# Patient Record
Sex: Female | Born: 1980 | Race: Black or African American | Hispanic: No | Marital: Single | Smoking: Never smoker
Health system: Southern US, Community
[De-identification: ages and names within clinical notes are randomized; demographics above are authoritative.]

---
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
## 2000-01-31 ENCOUNTER — Emergency Department (HOSPITAL_COMMUNITY): Admission: EM | Admit: 2000-01-31 | Discharge: 2000-01-31 | Payer: Self-pay | Admitting: Emergency Medicine

## 2000-01-31 ENCOUNTER — Encounter: Payer: Self-pay | Admitting: Emergency Medicine

## 2000-04-25 ENCOUNTER — Emergency Department (HOSPITAL_COMMUNITY): Admission: EM | Admit: 2000-04-25 | Discharge: 2000-04-25 | Payer: Self-pay | Admitting: Emergency Medicine

## 2000-05-14 ENCOUNTER — Inpatient Hospital Stay (HOSPITAL_COMMUNITY): Admission: AD | Admit: 2000-05-14 | Discharge: 2000-05-14 | Payer: Self-pay | Admitting: *Deleted

## 2000-11-29 ENCOUNTER — Other Ambulatory Visit: Admission: RE | Admit: 2000-11-29 | Discharge: 2000-11-29 | Payer: Self-pay | Admitting: Obstetrics and Gynecology

## 2001-02-26 ENCOUNTER — Inpatient Hospital Stay (HOSPITAL_COMMUNITY): Admission: AD | Admit: 2001-02-26 | Discharge: 2001-02-26 | Payer: Self-pay | Admitting: Obstetrics and Gynecology

## 2001-05-14 ENCOUNTER — Inpatient Hospital Stay (HOSPITAL_COMMUNITY): Admission: AD | Admit: 2001-05-14 | Discharge: 2001-05-14 | Payer: Self-pay | Admitting: Obstetrics and Gynecology

## 2001-05-22 ENCOUNTER — Inpatient Hospital Stay (HOSPITAL_COMMUNITY): Admission: AD | Admit: 2001-05-22 | Discharge: 2001-05-22 | Payer: Self-pay | Admitting: Obstetrics and Gynecology

## 2001-06-21 ENCOUNTER — Observation Stay (HOSPITAL_COMMUNITY): Admission: AD | Admit: 2001-06-21 | Discharge: 2001-06-21 | Payer: Self-pay | Admitting: Obstetrics and Gynecology

## 2001-06-22 ENCOUNTER — Inpatient Hospital Stay (HOSPITAL_COMMUNITY): Admission: AD | Admit: 2001-06-22 | Discharge: 2001-06-22 | Payer: Self-pay | Admitting: Obstetrics

## 2001-06-23 ENCOUNTER — Observation Stay (HOSPITAL_COMMUNITY): Admission: AD | Admit: 2001-06-23 | Discharge: 2001-06-23 | Payer: Self-pay | Admitting: Obstetrics and Gynecology

## 2001-06-24 ENCOUNTER — Inpatient Hospital Stay (HOSPITAL_COMMUNITY): Admission: AD | Admit: 2001-06-24 | Discharge: 2001-06-28 | Payer: Self-pay | Admitting: Obstetrics & Gynecology

## 2001-11-16 ENCOUNTER — Emergency Department (HOSPITAL_COMMUNITY): Admission: EM | Admit: 2001-11-16 | Discharge: 2001-11-17 | Payer: Self-pay

## 2001-12-06 ENCOUNTER — Encounter: Admission: RE | Admit: 2001-12-06 | Discharge: 2001-12-06 | Payer: Self-pay | Admitting: *Deleted

## 2002-06-29 ENCOUNTER — Emergency Department (HOSPITAL_COMMUNITY): Admission: EM | Admit: 2002-06-29 | Discharge: 2002-06-30 | Payer: Self-pay | Admitting: Emergency Medicine

## 2002-12-19 ENCOUNTER — Emergency Department (HOSPITAL_COMMUNITY): Admission: EM | Admit: 2002-12-19 | Discharge: 2002-12-19 | Payer: Self-pay | Admitting: *Deleted

## 2002-12-29 ENCOUNTER — Emergency Department (HOSPITAL_COMMUNITY): Admission: EM | Admit: 2002-12-29 | Discharge: 2002-12-29 | Payer: Self-pay | Admitting: Emergency Medicine

## 2002-12-29 ENCOUNTER — Encounter: Payer: Self-pay | Admitting: Emergency Medicine

## 2003-12-04 ENCOUNTER — Encounter: Admission: RE | Admit: 2003-12-04 | Discharge: 2003-12-04 | Payer: Self-pay | Admitting: Internal Medicine

## 2003-12-27 ENCOUNTER — Encounter: Admission: RE | Admit: 2003-12-27 | Discharge: 2003-12-27 | Payer: Self-pay | Admitting: Internal Medicine

## 2004-03-30 ENCOUNTER — Inpatient Hospital Stay (HOSPITAL_COMMUNITY): Admission: AD | Admit: 2004-03-30 | Discharge: 2004-03-30 | Payer: Self-pay | Admitting: *Deleted

## 2004-03-31 ENCOUNTER — Emergency Department (HOSPITAL_COMMUNITY): Admission: EM | Admit: 2004-03-31 | Discharge: 2004-03-31 | Payer: Self-pay | Admitting: Emergency Medicine

## 2004-04-04 ENCOUNTER — Inpatient Hospital Stay (HOSPITAL_COMMUNITY): Admission: AD | Admit: 2004-04-04 | Discharge: 2004-04-04 | Payer: Self-pay | Admitting: *Deleted

## 2004-09-30 ENCOUNTER — Emergency Department (HOSPITAL_COMMUNITY): Admission: EM | Admit: 2004-09-30 | Discharge: 2004-09-30 | Payer: Self-pay | Admitting: Emergency Medicine

## 2004-10-24 ENCOUNTER — Emergency Department (HOSPITAL_COMMUNITY): Admission: EM | Admit: 2004-10-24 | Discharge: 2004-10-24 | Payer: Self-pay | Admitting: Emergency Medicine

## 2004-10-24 ENCOUNTER — Inpatient Hospital Stay (HOSPITAL_COMMUNITY): Admission: AD | Admit: 2004-10-24 | Discharge: 2004-10-24 | Payer: Self-pay | Admitting: Obstetrics & Gynecology

## 2004-10-28 ENCOUNTER — Emergency Department (HOSPITAL_COMMUNITY): Admission: EM | Admit: 2004-10-28 | Discharge: 2004-10-28 | Payer: Self-pay | Admitting: Family Medicine

## 2004-11-01 ENCOUNTER — Observation Stay (HOSPITAL_COMMUNITY): Admission: AD | Admit: 2004-11-01 | Discharge: 2004-11-02 | Payer: Self-pay | Admitting: Obstetrics and Gynecology

## 2004-12-04 ENCOUNTER — Other Ambulatory Visit: Admission: RE | Admit: 2004-12-04 | Discharge: 2004-12-04 | Payer: Self-pay | Admitting: Obstetrics and Gynecology

## 2005-04-10 ENCOUNTER — Inpatient Hospital Stay (HOSPITAL_COMMUNITY): Admission: RE | Admit: 2005-04-10 | Discharge: 2005-04-13 | Payer: Self-pay | Admitting: Obstetrics and Gynecology

## 2005-04-14 ENCOUNTER — Inpatient Hospital Stay (HOSPITAL_COMMUNITY): Admission: AD | Admit: 2005-04-14 | Discharge: 2005-04-14 | Payer: Self-pay | Admitting: Obstetrics and Gynecology

## 2005-05-28 ENCOUNTER — Inpatient Hospital Stay (HOSPITAL_COMMUNITY): Admission: AD | Admit: 2005-05-28 | Discharge: 2005-05-28 | Payer: Self-pay | Admitting: Obstetrics and Gynecology

## 2005-06-16 ENCOUNTER — Encounter (INDEPENDENT_AMBULATORY_CARE_PROVIDER_SITE_OTHER): Payer: Self-pay | Admitting: *Deleted

## 2005-06-16 ENCOUNTER — Inpatient Hospital Stay (HOSPITAL_COMMUNITY): Admission: RE | Admit: 2005-06-16 | Discharge: 2005-06-19 | Payer: Self-pay | Admitting: Obstetrics and Gynecology

## 2006-03-26 ENCOUNTER — Emergency Department (HOSPITAL_COMMUNITY): Admission: EM | Admit: 2006-03-26 | Discharge: 2006-03-26 | Payer: Self-pay | Admitting: Emergency Medicine

## 2006-09-20 ENCOUNTER — Emergency Department (HOSPITAL_COMMUNITY): Admission: EM | Admit: 2006-09-20 | Discharge: 2006-09-20 | Payer: Self-pay | Admitting: Family Medicine

## 2006-11-25 ENCOUNTER — Emergency Department (HOSPITAL_COMMUNITY): Admission: EM | Admit: 2006-11-25 | Discharge: 2006-11-25 | Payer: Self-pay | Admitting: Family Medicine

## 2008-09-14 ENCOUNTER — Emergency Department (HOSPITAL_COMMUNITY): Admission: EM | Admit: 2008-09-14 | Discharge: 2008-09-14 | Payer: Self-pay | Admitting: Emergency Medicine

## 2010-12-06 ENCOUNTER — Emergency Department (HOSPITAL_COMMUNITY)
Admission: EM | Admit: 2010-12-06 | Discharge: 2010-12-06 | Payer: Self-pay | Source: Home / Self Care | Admitting: Emergency Medicine

## 2010-12-07 ENCOUNTER — Emergency Department (HOSPITAL_COMMUNITY)
Admission: EM | Admit: 2010-12-07 | Discharge: 2010-12-07 | Payer: Self-pay | Source: Home / Self Care | Admitting: Emergency Medicine

## 2011-04-20 ENCOUNTER — Emergency Department (HOSPITAL_COMMUNITY): Payer: Self-pay

## 2011-04-20 ENCOUNTER — Emergency Department (HOSPITAL_COMMUNITY)
Admission: EM | Admit: 2011-04-20 | Discharge: 2011-04-20 | Disposition: A | Discharge status: Home / Self Care | Payer: Self-pay | Attending: Emergency Medicine | Admitting: Emergency Medicine

## 2011-04-20 DIAGNOSIS — M549 Dorsalgia, unspecified: Secondary | ICD-10-CM | POA: Insufficient documentation

## 2011-04-20 DIAGNOSIS — R071 Chest pain on breathing: Secondary | ICD-10-CM | POA: Insufficient documentation

## 2011-04-20 DIAGNOSIS — R0602 Shortness of breath: Secondary | ICD-10-CM | POA: Insufficient documentation

## 2011-04-20 DIAGNOSIS — I517 Cardiomegaly: Secondary | ICD-10-CM | POA: Insufficient documentation

## 2011-04-20 LAB — BASIC METABOLIC PANEL WITH GFR
BUN: 10 mg/dL (ref 6–23)
CO2: 28 meq/L (ref 19–32)
Calcium: 8.7 mg/dL (ref 8.4–10.5)
Chloride: 107 meq/L (ref 96–112)
Creatinine, Ser: 0.76 mg/dL (ref 0.4–1.2)
GFR calc non Af Amer: >60 mL/min
Glucose, Bld: 87 mg/dL (ref 70–99)
Potassium: 3.9 meq/L (ref 3.5–5.1)
Sodium: 141 meq/L (ref 135–145)

## 2011-05-08 NOTE — Discharge Summary (Signed)
[name], [name]                   ACCOUNT NO.:  [id number]   MEDICAL RECORD NO.:  [id number]          PATIENT TYPE:  INP   LOCATION:  9148                          [facility]ILITY:  [facility]   PHYSICIAN:  Hal Morales, M.D.DATE OF BIRTH:  1981-05-07   DATE OF ADMISSION:  06/16/2005  DATE OF DISCHARGE:  06/19/2005                                 DISCHARGE SUMMARY   ADMISSION DIAGNOSES:  1.  Term intrauterine pregnancy.  2.  Previous cesarean section.  3.  Desired repeat cesarean section.  4.  Desired sterilization.   DISCHARGE DIAGNOSES:  1.  Term intrauterine pregnancy.  2.  Previous cesarean section.  3.  Desired repeat cesarean section.  4.  Desired sterilization.  5.  Upper respiratory infection.  6.  Post operative fever, probably secondary to #5   PROCEDURES:  1.  Repeat low transverse cesarean section.  2.  Bilateral tubal sterilization.  3.  Spinal anesthesia.   HOSPITAL COURSE:  Ms. Hoeschen is a 30 year old gravida 4, para 1-2-1 at 38-6/7  weeks who was admitted on June 16, 2005 for an elective repeat cesarean  section and tubal sterilization.  The pregnancy has been remarkable for 1.  Previous cesarean section.  2.  First trimester bleeding.  3.  History of  post partum depression.  4.  Desired sterilization.  The patient was taken  to the operating room where a repeat low transverse cesarean section was  performed by Dr. Dierdre Forth under spinal anesthesia.  The findings were  a viable female.  Weight 7 pounds 7 ounces.  Apgars were 9 and 9.  The  infant's name was Tamia.  The patient tolerated the procedure well.  She was  taken to the recovery room in good condition.  The infant was taken to the  full term nursery.  The evening after surgery, the patient developed a  temperature elevation to 102.3.  Blood cultures, urine culture, chest xray  were all ordered, and the patient was started on Unasyn. By postoperative  day #1 the patient was recovering well.  However,  she had a continuing cough  and upper respiratory congestion.  The baby was breast feeding.  Her  hemoglobin on day #1 was 9.7, down from 11.4.  White blood cell count was  9.1 and platelet count was 243.  The patient had a productive cough with  clear sputum.  A chest x-ray showed clear lungs.  Incentive spirometry was  begun and ambulation was encouraged.  The patient continued on Unasyn for a  three day course.  By postoperative day #2 the patient was improving.  Her  cultures were noted to be negative so far.  A blood culture was still  pending.  Her temperature maximum was 101.9 at 11 a.m. on June 17, 2005.  She had a JP drain and subcuticular sutures.  The JP drain was draining a  small amount.  By postoperative day #3 the patient was doing well.  She had  a slight dizzy spell on the evening of June 18, 2005.  However, this was  just  after a Percocet dose.  A sporadic cough with clear mucus and phlegm  was noted and there was some mild nasal congestion.  The patient had been  afebrile for greater than 24 hours at that time.  Her incision was clean,  dry, and intact.  The Jackson-Pratt drain was draining a very, very small  amount.  Her abdomen was soft and non-tender.  Dr. Pennie Rushing was consulted.  The decision was made to remove the JP drain, which was done without  difficulty and to discharge the patient home with the plan for four  additional days of oral antibiotics.  The patient did feel well and was able  to be up ambulating without difficulty.  She was deemed to receive full  benefit of her hospital stay and was discharged home.   DISCHARGE INSTRUCTIONS:  Per [facility] handout.   DISCHARGE MEDICATIONS:  1.  Motrin 600 mg p.o. q.6h. p.r.n. pain.  2.  Percocet 5/325 one-two p.o. q.three-four hours p.r.n. pain.  3.  Augmentin 875 mg take one p.o. b.i.d. xfour days.  4.  Prenatal vitamin one p.o. daily.   ADDENDUM:  There were also no signs or symptoms of post partum  depression.       VLL/MEDQ  D:  06/19/2005  T:  06/19/2005  Job:  [id number]

## 2011-05-08 NOTE — Op Note (Signed)
[facility]  Patient:    [name], [name]                          MRN: [id number] Proc. Date: 06/25/01 Adm. Date:  [id number] Attending:  Leonard Schwartz                           Operative Report  PREOPERATIVE DIAGNOSES:       1. Intrauterine pregnancy at term.                               2. Failure to descend.  POSTOPERATIVE DIAGNOSES:      1. Intrauterine pregnancy at term.                               2. Failure to descend.                               3. Cephalopelvic disproportion.  OPERATION:                    Primary low-transverse cesarean section.  SURGEON:                      Vanessa P. Pennie Rushing, M.D.  FIRST ASSISTANT:              Wynelle Bourgeois, CNM  ANESTHESIA:                   Epidural.  ESTIMATED BLOOD LOSS:         Less than 750 cc.  COMPLICATIONS:                None.  FINDINGS:                     The patient was delivered of a female infant who name is [name], weighing 8 pounds with Apgars of 7 and 9 at one and five minutes, respectively.  The placenta contained an eccentrically inserted three-vessel cord.  PROCEDURE:                    The patient was taken to the operating room after appropriate identification with a labor epidural and Foley catheter in place.  She was placed on the operating table in the supine position with a left lateral tilt.  The abdomen was prepped with multiple layers of Betadine and draped as a sterile field.  After assurance of adequate anesthesia, a transverse incision was made in the abdomen and the abdomen opened in layers. The peritoneum was entered.  The uterus was incised approximately 1 cm above the uterovesical fold and the infant delivered from the opposite transverse position.  And after having the nares and pharynx suctioned and the cord clamped and cut, was handed off to the awaiting pediatricians.  The appropriate cord blood was drawn and the placenta noted to have separated  from the uterus and was removed with gentle traction.  The uterine incision was then closed with a running interlocking suture of 0 Vicryl.  An imbricating suture of 0 Vicryl was then placed.  Hemostasis was noted to be adequate.  The visceral peritoneum was repaired with a figure-of-eight suture of 2-0  Vicryl. Copious irrigation was carried out and the abdominal peritoneum closed with a running suture of 2-0 Vicryl.  The rectus muscles were reapproximated in the midline with a figure-of-eight suture of 2-0 Vicryl.  The rectus fascia was then closed with a running suture of 0 Vicryl, then reinforced on either side of midline with figure-of-eight sutures of 0 Vicryl.  The subcutaneous tissue was irrigated and made hemostatic with Bovie cautery.  Skin staples were applied to the skin incision.  A sterile dressing was applied.  The patient was then taken from the operating room to the recovery room in satisfactory condition, having tolerated the procedure well with sponge and instrument counts correct.  The infant went to the full term nursery. DD:  06/25/01 TD:  06/25/01 Job: [id number]

## 2011-05-08 NOTE — H&P (Signed)
NAMEARIELLAH, FAUST NO.:  [id number]   MEDICAL RECORD NO.:  [id number]           PATIENT TYPE:   LOCATION:                                 FACILITY:   PHYSICIAN:  Hal Morales, M.D.     DATE OF BIRTH:   DATE OF PROCEDURE:  DATE OF DISCHARGE:                      STAT - MUST CHANGE TO CORRECT WORK TYPE   HISTORY:  This is a 30 year old gravida 4, para 1, 0, 2, 1, at 38-6/7 weeks,  who presents for an elective repeat cesarean section and tubal ligation.  Pregnancy has been followed by Dr. Pennie Rushing, unremarkable for:  1. Previous C. section.  2. First trimester bleeding.  3. History of postpartum depression.   OBSTETRIC HISTORY:  Remarkable for an elective abortion in 1998 at [redacted] weeks  gestation. A C-section in 2002 of a female infant at [redacted] weeks gestation,  weighing 8 pounds, for failure to descend after 17 hours of labor. She had a  spontaneous abortion in 2005 at 4 weeks without surgery.   PAST MEDICAL HISTORY:  1. Remarkable for history of postpartum depression in 2002 which lasted 2      weeks with no medications.  2. She has a history of gonorrhea in 2003.  3. Childhood Varicella.  4. History of migraines as a younger person but none recently.   SOCIAL HISTORY:  Remarkable for a C-section in 2002.   FAMILY HISTORY:  Remarkable for an aunt with heart attack. Mother with  hypertension, grandmother with hypertension, and a cousin with hypertension.  Grandfather and grandmother with diabetes. Mother with seizures. A  grandmother with stroke. A grandmother with brain tumor.   GENETIC HISTORY:  Unremarkable.   SOCIAL HISTORY:  The patient is engaged to [employer] who is  involved and supportive. She does not report a religious affiliation. She  denies any alcohol, tobacco or drug use.   PRENATAL LABS:  Hemoglobin 12.9. Blood type O positive, antibody screen  negative. Sickle cell negative. RPR nonreactive. Rubella immune. Hepatitis  Negative. HIV negative. Cystic fibrosis negative. Glucola within normal  limits.   HISTORY OF CURRENT PREGNANCY:  The patient entered care at [redacted] weeks  gestation. She had a fall at 16 weeks with no sequelae. She had a normal  ultrasound at 19 weeks. She had some unexplained mild proteinuria at 23  weeks which resolved spontaneously. She had Glucola at 27 weeks which was  normal. She had some preterm labor at 28 weeks treated with bedrest and  terbutaline. She presents at term for repeat cesarean section and tubal  ligation.   PHYSICAL EXAMINATION:  VITAL SIGNS:  Stable, afebrile.  HEENT:  Within normal limits.  NECK:  Thyroid normal, no enlargement.  CHEST:  Clear to auscultation.  HEART:  Regular rate and rhythm.  ABDOMEN:  Gravid at 38 cm, vertex to Leopold's. Fetal heart rate 150's.  PELVIC EXAM:  Deferred.  EXTREMITIES:  Within normal limits.   ASSESSMENT:  1. Intrauterine pregnancy at 75 and 6/7 weeks.  2. Previous cesarean section.  3. Desires repeat cesarean section and tubal ligation.  PLAN:  Admit to operating suite per protocol and further orders to follow. A  discussion was held with the patient concerning the indication for her  procedures and the risks involved.  She desires no further children, but  acknowledges understanding that there is a small failure rate associated  with tubal sterilization which could result in subsequent pregnancy.  She  likewise acknowledges the risks of anesthesia, infection, bleeding and  damage to adjacent organs.       MLW/MEDQ  D:  06/16/2005  T:  06/16/2005  Job:  [id number]

## 2011-05-08 NOTE — Discharge Summary (Signed)
NAMEANIQUA, BRIERE NO.:  [id number]   MEDICAL RECORD NO.:  [id number]          PATIENT TYPE:  OBV   LOCATION:  9319                          [facility]ACILITY:  [facility]   PHYSICIAN:  Janine Limbo, M.D.DATE OF BIRTH:  1981/04/27   DATE OF ADMISSION:  11/01/2004  DATE OF DISCHARGE:  11/02/2004                                 DISCHARGE SUMMARY   ADMITTING DIAGNOSES:  1.  First trimester pregnancy.  2.  Nausea and vomiting.  3.  Hyperemesis.   DISCHARGE DIAGNOSES:  1.  First trimester pregnancy.  2.  Nausea and vomiting.  3.  Hyperemesis (stable on discharge).   Ms. Brandstetter is a 30 year old gravida 3 para 1-0-2-1 who presented at [redacted] weeks  gestation with severe nausea and vomiting.  She was admitted overnight for  IV fluid hydration and antiemetics.  These worked well and by the following  day she was able to keep down a regular diet with no further nausea and  vomiting.  Her vital signs were stable, she remained afebrile.  Lab work:  Her UA on admission showed ketones and follow-up UA was negative.  Her  quantitative hCG was 36,967.  Her ultrasound showed a viable intrauterine  pregnancy at 5 weeks 6 days.  Her chest was clear, her heart regular rate  and rhythm, abdomen soft and nontender.  In light of the fact that she was  stable and able to tolerate p.o. well after 24 hours, she was discharged  home.   ASSESSMENT:  First trimester pregnancy, nausea and vomiting/hyperemesis.   PLAN:  The patient is discharged home.  She is given a prescription for  Phenergan 25 mg p.o. q.6h. p.r.n. nausea and vomiting.  She may take  Tylenol, Sudafed, or Robitussin as needed for cold symptoms.  She will  return to the office of [facility] as scheduled next week for her new OB visit,  and call for any problems or concerns in the meantime.     Pecolia Ades   SDM/MEDQ  D:  11/23/2004  T:  11/24/2004  Job:  [id number]

## 2011-05-08 NOTE — Discharge Summary (Signed)
[name], [name]                   ACCOUNT NO.:  [id number]   MEDICAL RECORD NO.:  [id number]          PATIENT TYPE:  INP   LOCATION:  9158                          [facility]ILITY:  [facility]   PHYSICIAN:  Naima A. Dillard, M.D. DATE OF BIRTH:  09/20/1981   DATE OF ADMISSION:  04/10/2005  DATE OF DISCHARGE:  04/10/2005                                 DISCHARGE SUMMARY   ADMISSION DIAGNOSES:  1.  Intrauterine pregnancy at 29-2/7 weeks.  2.  Preterm labor.  3.  Premature cervical effacement.   DISCHARGE DIAGNOSES:  1.  Intrauterine pregnancy at 29-2/7 weeks.  2.  Preterm labor.  3.  Premature cervical effacement.   HOSPITAL PROCEDURES:  1.  Electronic fetal monitoring.  2.  Ultrasound.  3.  Betamethasone series.   HOSPITAL COURSE:  The patient was admitted for an incidental finding of  premature cervical effacement on ultrasound.  She had been scheduled to have  a gallbladder ultrasound but that was not done due to the patient eating.  The patient reported increased pressure and cramps for the past week.  Upon  admission her fetal heart rate was reactive.  There was uterine irritability  noted.  She was given a dose of Terbutaline.  Liver function tests were  within normal limits.  She was given a betamethasone series to enhance fetal  lung maturity.  Fetal fibronectin was done the next day and returned with a  positive result.  She was kept on bedrest and monitored throughout.  She did  well.  On April 13, 2005 she was doing well and feeling a few scattered  contractions, which were not strong.  Vital signs were stable.  Chest was  clear.  Heart rate was a regular rate and rhythm.  Abdomen nontender.  Fetal  heart rate was reassuring with accelerations.  There were no uterine  contractions visible on the monitor.  The cervical exam was deferred.  Extremities were within normal limits.  She was deemed to have receive the  full benefit of her hospital stay and was discharged home.   DISCHARGE MEDICATIONS:  Terbutaline 2.5 mg p.o. q.4h. p.r.n.   DISCHARGE LABORATORIES:  Group B streptococcus was negative.  Fetal  fibronectin positive.   DISCHARGE INSTRUCTIONS:  The patient will remain on level III bedrest.   DISCHARGE FOLLOWUP:  The patient will return to the office in one week for  evaluation or p.r.n.      MLW/MEDQ  D:  04/13/2005  T:  04/13/2005  Job:  [id number]

## 2011-05-08 NOTE — H&P (Signed)
[facility]  Patient:    [name], [name]                          MRN: [id number] Adm. Date:  [id number] Disc. Date: [id number] Attending:  Leonard Schwartz Dictator:   Wynelle Bourgeois, CNM                         History and Physical  HISTORY OF PRESENT ILLNESS:   This is a 30 year old G3, para 0-0-2-0 at 39-3/7 weeks who presents with complaints of regular uterine contractions times several hours today.  She denies leaking or bleeding and reports positive fetal movement.  Her pregnancy has been followed by Dr. Erie Noe P. Haygood and has been remarkable for:  #1 - First trimester bleeding, and #2 - teen, and #3 - preterm labor.  PRENATAL LABORATORY DATA:     Hemoglobin of 12.4, hematocrit of 38.1, platelets 318,000.  Blood type O-positive, antibody screen negative.  Sickle cell negative.  RPR nonreactive.  Rubella immune.  HBsAg negative.  Pap test within normal limits.  Gonorrhea negative.  Chlamydia negative.  AFP free beta within normal limits.  Glucose challenge within normal limits.  Group B strep results not available.  OBSTETRICAL HISTORY:          Remarkable for elective ABs in July of 2000 and July of 2001.  MEDICAL HISTORY:              Remarkable for rare yeast infection, childhood varicella.  SURGICAL HISTORY:             Remarkable for D&Cs with abortions x 2.  FAMILY HISTORY:               Remarkable for maternal aunt with congestive heart failure, maternal grandmother with MI, maternal grandmother with hypertension, maternal grandfather with thrombus, maternal grandfather with COPD, maternal grandmother with insulin-dependent diabetes, mother with seizures and a paternal grandmother with a brain tumor.  GENETIC HISTORY:              Unremarkable.  SOCIAL HISTORY:               Patient is single, involved with Johnathan Hausen, who is presently not here at this moment.  She is of the [place] faith.  She denies any alcohol,  tobacco or drug use.  PHYSICAL EXAMINATION:  VITAL SIGNS:                  Vital signs stable, afebrile.  HEENT:                        Within normal limits.  NECK:                         Thyroid normal, not enlarged.  BREASTS:                      Soft, nontender.  No masses.  CARDIOVASCULAR:               Regular rate and rhythm.  RESPIRATORY:                  Clear to auscultation bilaterally.  ABDOMEN:                      Gravid at 40 cm.  EFM  shows fetal heart rate of 150s with occasional variable decelerations and uterine contractions every one and a half to two minutes.  PELVIC:                       Cervical exam is 3 to 4 cm, 90% effaced and -2 station.  EXTREMITIES:                  Within normal limits.  ASSESSMENT:                   1. Intrauterine pregnancy at 39-3/7 weeks.                               2. Early active labor.  PLAN:                         1. Admit to birthing suite per                                  Dr. Janine Limbo.                               2. Routine M.D. orders.                               3. Analgesia with Stadol and Phenergan for now,                                  then epidural after 4 cm.                               4. Further orders per Dr. Stefano Gaul. DD:  06/21/01 TD:  06/21/01 Job: [id number]
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
## 2011-05-08 NOTE — Discharge Summary (Signed)
[facility] [place]  Patient:    [name], [name]                          MRN: [id number] Adm. Date:  [id number] Disc. Date: 06/28/01 Attending:  Leonard Schwartz Dictator:   Wynelle Bourgeois, C.N.M.                           Discharge Summary  ADMISSION DIAGNOSES:          1. Intrauterine pregnancy at term.                               2. Spontaneous rupture of membranes.                               3. Early active labor.                               4. Desires epidural.  DISCHARGE DIAGNOSES:          1. intrauterine pregnancy at term.                               2. Spontaneous rupture of membranes.                               3. Early active labor.                               4. Desires epidural.                               5. Status post primary low transverse cesarean                                  section.                               6. Endometritis.  PROCEDURE:                    1. Epidural anesthesia.                               2. Primary low transverse cesarean section.                               3. Intravenous antibiotics.  HOSPITAL COURSE:              This is a 30 year old G3, para 0-0-2-0 at 65 6/7 weeks who presented from the office in early labor and proceeded to have spontaneous rupture of membranes with ensuing labor.  She received an epidural for anesthesia at 8:40 p.m. on June 24, 2001 and continued to have labor with internal monitors placed for accurate monitoring of labor.  On June 25, 2001 in the  morning she had progressed to 9+ cm and began pushing at approximately 8:30 a.m. with marked fetal heart rate decelerations and she had pushed the vertex down to caput at +2 station.  During second stage she continued to have decelerations of the fetal heart rate and as time progressed some of the decelerations began to have late return to baseline despite oxygen therapy and discontinuance of Pitocin.  She developed a  fever of 101.3 and options were discussed with the patient.  At that time she elected to proceed with low transverse cesarean section for failure to descend.  She was delivered of a viable female infant named [name] weighing 8 pounds, Apgars 7 and 9.  On postoperative day #1 she was doing well.  She was afebrile.  She had a hemoglobin of 11.1 and her dressing was dry and intact with moderate lochia. Routine postoperative care was continued.  On June 26, 2001 she developed a fever of 102 and was determined by Dr. Pennie Rushing to have probable endometritis and was begun on Cefotan antibiotics.  On postoperative day #2 she was doing well and had a firm fundus with lochia within normal limits and resolving endometritis.  Her antibiotics were changed to Keflex p.o.  On postoperative day #3 she had been afebrile for 24 hours with the exception of a single temperature of 100.9 which was 99.0 20 minutes later.  Breasts were filling. Incision was clean, dry, and intact.  Abdomen was soft and appropriately tender.  Lochia was small.  She was deemed to have received the full benefit of her hospital stay and was discharged home.  DISCHARGE LABORATORIES:       White blood cell count 14.7, hemoglobin 12.4, hematocrit 36.4, platelets 241,000.  RPR nonreactive.  DISCHARGE MEDICATIONS:        1. Ibuprofen 600 mg p.o. q.6h. p.r.n.                               2. Tylox one to two p.o. q.3-4h. p.r.n.                               3. Keflex 500 mg q.i.d. x 10 days.  DISCHARGE INSTRUCTIONS:       Per [facility] handout.  DISCHARGE FOLLOW-UP:          Six weeks at [facility] or p.r.n. DD:  06/28/01 TD:  06/28/01 Job: [id number]

## 2011-05-08 NOTE — H&P (Signed)
[name], [name]                   ACCOUNT NO.:  [id number]   MEDICAL RECORD NO.:  [id number]          PATIENT TYPE:  OBV   LOCATION:  9158                          [facility]:  [facility]   PHYSICIAN:  Crist Fat. Rivard, M.D. DATE OF BIRTH:  07-Jun-1981   DATE OF ADMISSION:  04/10/2005  DATE OF DISCHARGE:                                HISTORY & PHYSICAL   This is a 30 year old, gravida 4, para 1-0-2-1, at 29-2/7th weeks, who  presents with shortened cervix per ultrasound today.  She was supposed to  also have a gallbladder ultrasound but had eaten and that was not done.  She  states she had increased pressure and cramps over the last week.   Pregnancy has been followed by Dr. Pennie Rushing and remarkable for:  1.  Previous C-section.  2.  First trimester bleeding.  3.  Postpartum depression.   OBSTETRICAL HISTORY:  1.  Remarkable for elective abortion in 1998.  2.  C-section delivery in 2002, for failure to descend of a female infant at      79 weeks' gestation, weighing 8 pounds.  3.  She had a spontaneous abortion in 2005.   MEDICAL HISTORY:  1.  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  2.  History of gonorrhea in 2003 for which she was treated.  3.  Childhood varicella.  4.  History of migraines as a younger person.   FAMILY HISTORY:  Remarkable for an aunt with a heart attack.  Mother and  grandmother and cousin with hypertension.  Grandfather and grandmother with  diabetes.  Mother with seizures.  Grandmother with stroke and a grandmother  with a brain tumor.   SURGICAL HISTORY:  1.  C-section in 2002.  2.  Elective abortion in 1998.   GENETIC HISTORY:  Unremarkable.   SOCIAL HISTORY:  The patient is single.  Father of the baby is not currently  present.  She works as a Conservation officer, nature.  She denies any alcohol, tobacco, or drug  use.   PRENATAL LABS:  Hemoglobin 12.4, platelets 323.  Blood type O positive.  Antibody screen negative.  Sickle cell negative.  RPR nonreactive.  Rubella  immune.  Hepatitis negative.  HIV negative.  Cystic fibrosis negative.   HISTORY OF CURRENT PREGNANCY:  The patient entered care at 37 weeks'  gestation.  She had a fall at 15 weeks with no complications.  She had an  ultrasound at 19 weeks which was normal.  __________  cervical length at 3.3-  cm.  She had some unexplained mild proteinuria at 23 weeks and had a 24-hour  urine for which results are not available.   OBJECTIVE DATA:  VITAL SIGNS:  Stable afebrile.  HEENT:  Within normal limits.  Thyroid normal, not enlarged.  CHEST:  Clear to auscultation.  HEART:  Regular rate and rhythm.  ABDOMEN:  Gravid, 29-cm.  Fetal monitor denotes reactive fetal heart rate  with uterine irritability initially which stopped with terbutaline.  PELVIC:  Cervix is closed, 75%, -3 with vertex presentation.  EXTREMITIES:  Within normal limits.   Gonorrhea Chlamydia  cultures were done.  Fetal fibronectin will have to be  done at a later time due to the patient having a vaginal ultrasound today.  Group B strep was done.  Ultrasound shows cervical length of 0.83-cm length  with normal AFI and cephalic presentation.   ASSESSMENT:  1.  Intrauterine pregnancy at 29-2/7th weeks.  2.  Preterm labor with cervical effacement.   PLAN:  1.  Admit to antenatal per Dr. Estanislado Pandy.  2.  Betamethasone series.  3.  Gallbladder ultrasound in a.m. after NPO.  4.  Terbutaline p.r.n.      MLW/MEDQ  D:  04/10/2005  T:  04/10/2005  Job:  [id number]

## 2011-05-08 NOTE — Op Note (Signed)
[name], [name]                   ACCOUNT NO.:  [id number]   MEDICAL RECORD NO.:  [id number]          PATIENT TYPE:  INP   LOCATION:  9198                          [facility]:  [facility]   PHYSICIAN:  [name], M.D.DATE OF BIRTH:  12/29/1980   DATE OF PROCEDURE:  06/16/2005  DATE OF DISCHARGE:                                 OPERATIVE REPORT   PREOPERATIVE DIAGNOSES:  1. Intrauterine pregnancy at term.  2. Prior cesarean section, desire for repeat cesarean section.  3. Desire for surgical sterilization.   POSTOPERATIVE DIAGNOSES:  1. Intrauterine pregnancy at term.  2. Prior cesarean section, desire for repeat cesarean section.  3. Desire for surgical sterilization.   OPERATION:  1. Repeat low transverse cesarean section.  2. Bilateral tubal sterilization surgeon.   SURGEON:  [name], M.D.   FIRST ASSISTANT:  [name], C.N.M.   ANESTHESIA:  Spinal.   ESTIMATED BLOOD LOSS:  750 mL.   COMPLICATIONS:  None.   SPECIMEN:  Bilateral portions of tube.   FINDINGS:  The uterus, tubes and ovaries were normal for the gravid state.  The patient was delivered of a female infant whose name is [name], weighing 7  pounds 7 ounces, with Apgars of 9 and 9 at one and five minutes,  respectively.   PROCEDURE:  The patient was taken to the operating room after appropriate  identification and placed on the operating table.  After the attainment of  adequate spinal anesthesia, she was placed in the supine position with a  left lateral tilt.  The abdomen and perineum were prepped with multiple  layers of Betadine and a Foley catheter inserted into the bladder and  connected to straight drainage.  The abdomen was draped as a sterile field.  After assurance of adequate anesthesia, 15 mL  of 0.25% Marcaine was used to  infiltrate the suprapubic region.  A suprapubic incision was made  approximately two fingerbreadths above the symphysis pubis and the abdomen  opened in  layers.  The peritoneum was entered and the bladder blade placed.  The uterus was incised approximately 2 cm above the uterovesical fold and  that incision taken laterally bluntly.  The infant was delivered from the  occiput transverse position with the aid of a Kiwi vacuum extractor and  after having the nares and pharynx suctioned and cord clamped and cut, was  handed off to the awaiting pediatricians.  The appropriate cord blood was  drawn and the placenta noted to have separated from the uterus and was  removed from the operative field.  The uterine incision was closed with  running interlocking suture of 0 Vicryl.  An imbricating suture of 0 Vicryl  was then placed.  This occurred after the cervix and then dilated with a  sponge forceps.  Hemostasis was noted to be adequate.  The left fallopian  tube was identified, followed to its fimbriated end, then grasped at the  isthmic portion and elevated.  A suture of 2-0 chromic was placed through  the mesosalpinx and tied fore and aft on the knuckle of  tube.  A second  ligature was placed proximal to that and the intervening knuckle of tube cut  and the cut ends cauterized.  A similar procedure was carried out on the  opposite side.  Prior to undergoing the tubal sterilization procedure, the  patient was notified that we were about to do that and assurance was made  that she wanted to proceed with the tubal, at she had signed for it better  than 30 days ago and signed again the consent prior to entering the  operating room.  Copious irrigation was carried out.  Hemostasis was noted  to be adequate, and the abdominal peritoneum was closed with running suture  of 2-0 Vicryl.  The rectus muscles were reapproximated in the midline with a  figure-of-eight suture of 2-0 Vicryl.  A defect in the fascia was closed  with figure-of-eight suture of 0 Vicryl.  The fascial incision was closed  with a running suture of 0 Vicryl, then reinforced on either  side of midline  with figure-of-eight sutures of 0 Vicryl.  The subcutaneous tissue was  irrigated and made hemostatic with Bovie cautery.  A Jackson-Pratt drain was  placed in the subcutaneous space and exited through a stab wound in the left  lower quadrant.  It was sewn in place with a suture of zero silk.  The skin  incision was closed with a subcuticular suture 3-0 Monocryl.  Sterile  dressings were applied.  The patient was taken from the operating room to  the recovery room in satisfactory condition having tolerated the procedure  well with sponge and instrument counts correct.  The infant went to the full-  term nursery.       VPH/MEDQ  D:  06/16/2005  T:  06/16/2005  Job:  [id number]
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
## 2012-08-12 IMAGING — CR DG CHEST 2V
2 series · 2 of 2 positions shown · non-contrast
Comparison: 06/17/2005

CLINICAL DATA: Pain and shortness of breath

CHEST - 2 VIEW

[w chest pa]
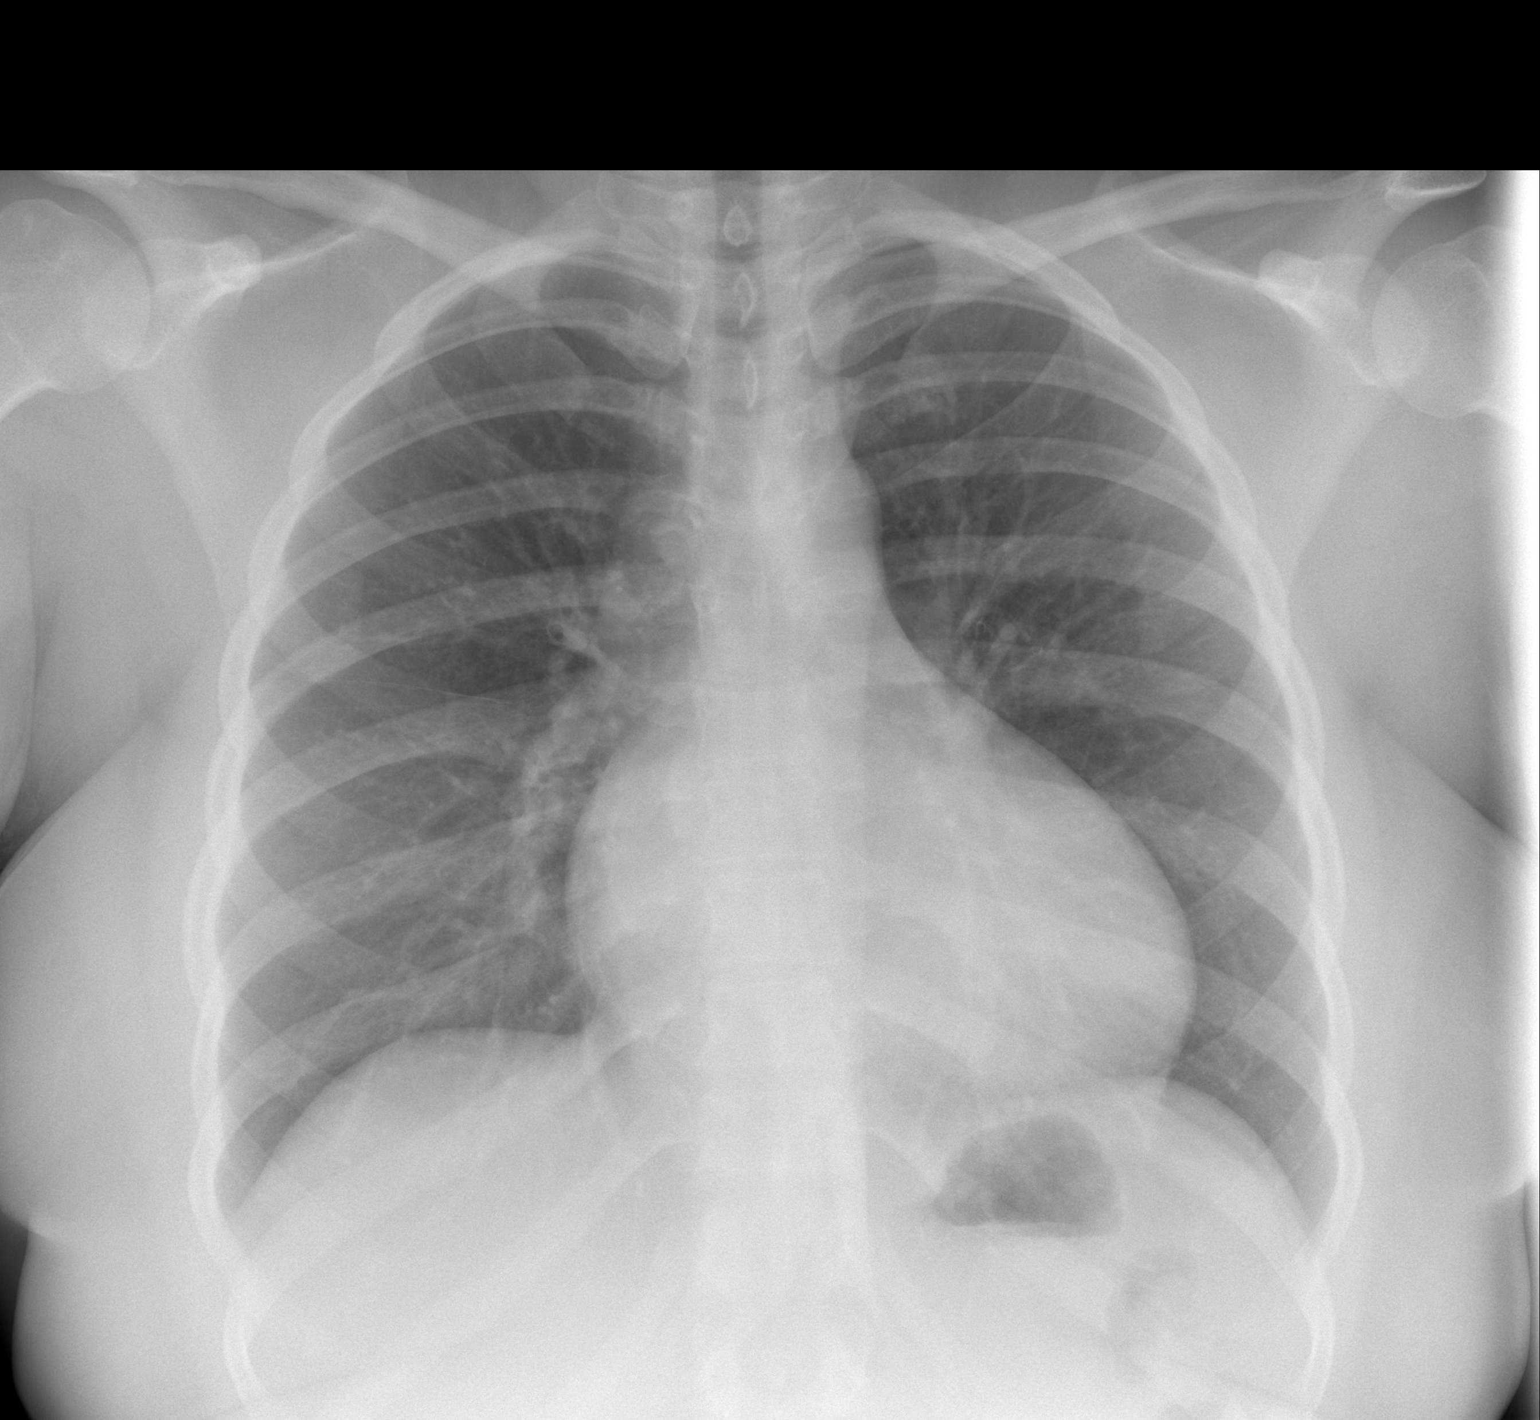

[w chest lat]
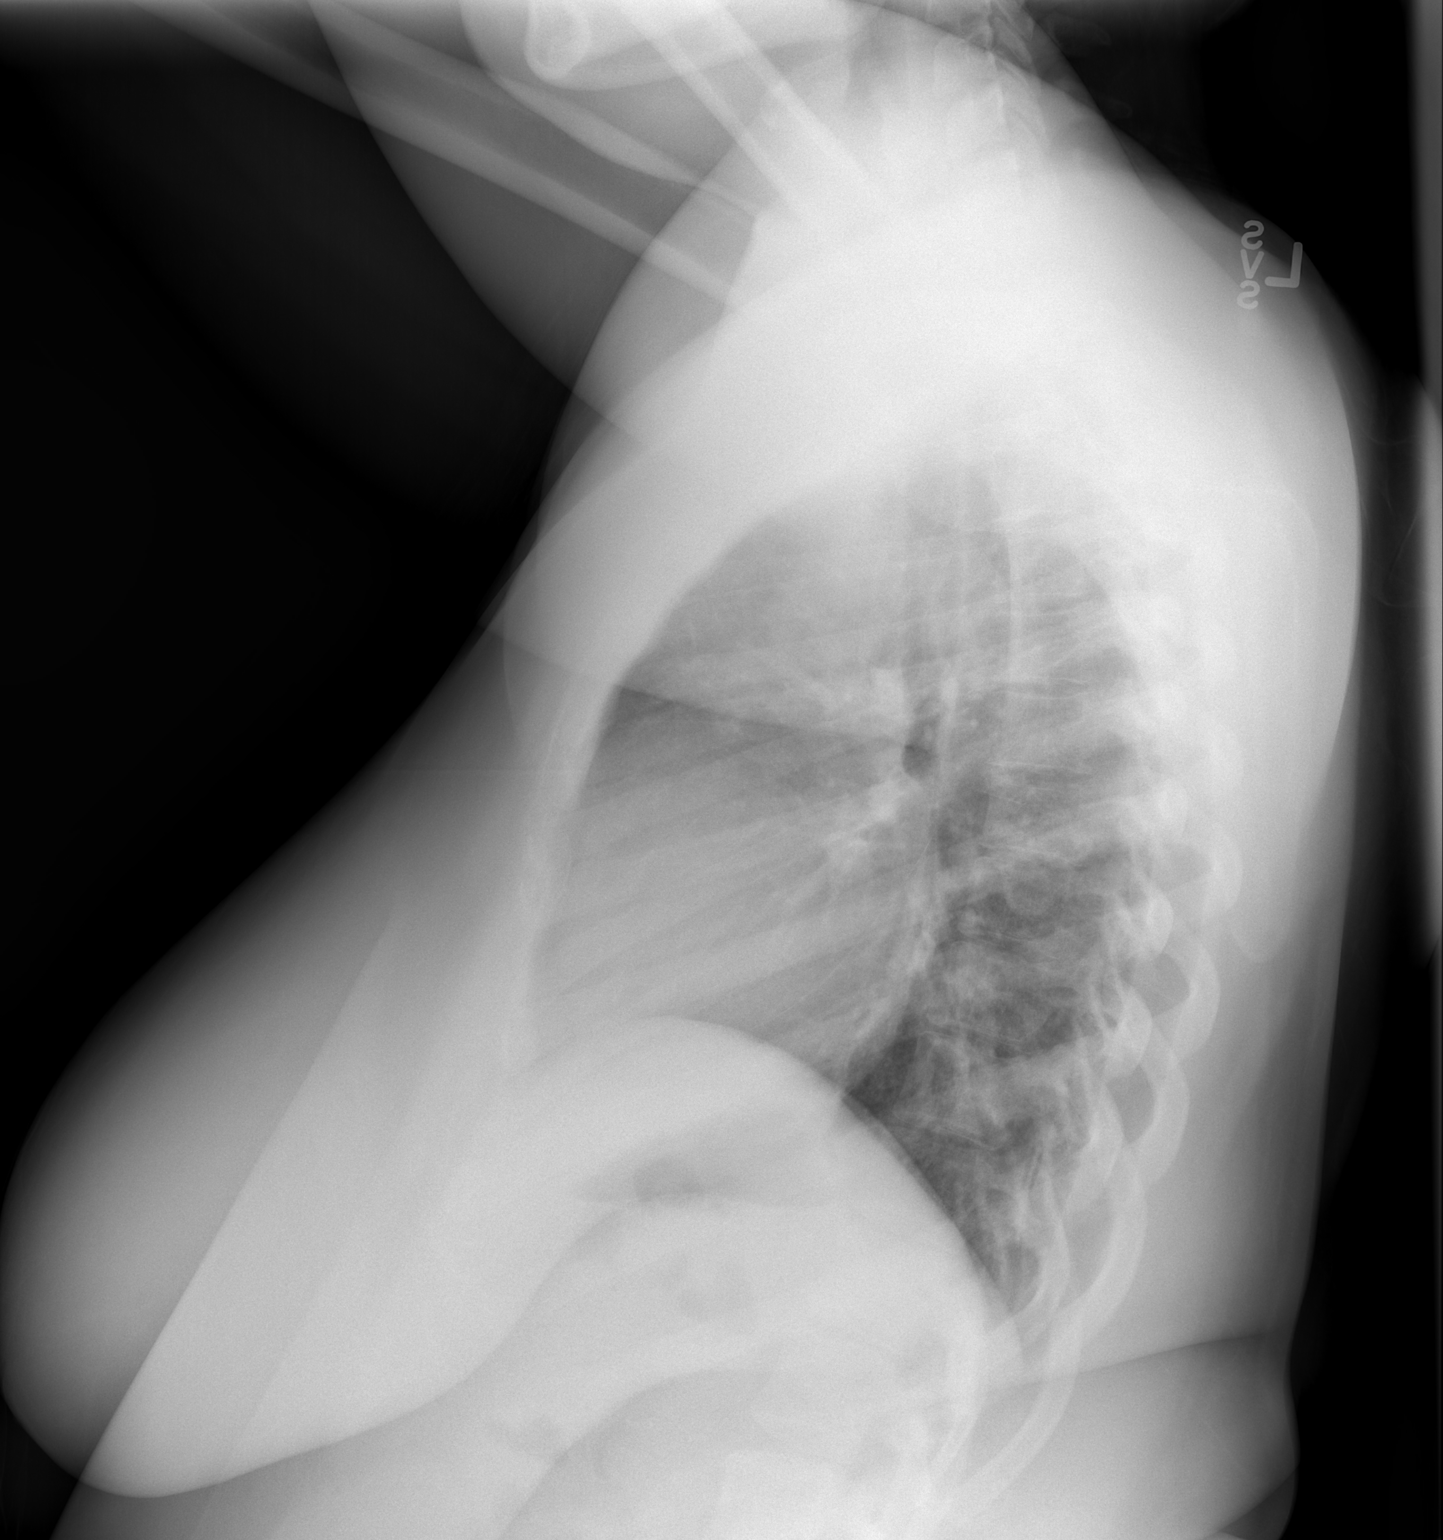

[2 of 2 positions shown; findings below may reference images not displayed]

FINDINGS: The heart is mildly enlarged.  Normal pulmonary
vascularity.  Mild bronchitic changes are stable.  No pneumothorax.
No pleural effusion.
IMPRESSION: Cardiomegaly without decompensation and there is no active
cardiopulmonary disease.

## 2012-12-30 ENCOUNTER — Encounter (HOSPITAL_COMMUNITY): Payer: Self-pay | Admitting: Emergency Medicine

## 2012-12-30 ENCOUNTER — Emergency Department (HOSPITAL_COMMUNITY)
Admission: EM | Admit: 2012-12-30 | Discharge: 2012-12-31 | Disposition: A | Discharge status: Home / Self Care | Payer: Self-pay | Attending: Emergency Medicine | Admitting: Emergency Medicine

## 2012-12-30 DIAGNOSIS — R109 Unspecified abdominal pain: Secondary | ICD-10-CM

## 2012-12-30 DIAGNOSIS — R1031 Right lower quadrant pain: Secondary | ICD-10-CM | POA: Insufficient documentation

## 2012-12-30 DIAGNOSIS — IMO0002 Reserved for concepts with insufficient information to code with codable children: Secondary | ICD-10-CM | POA: Insufficient documentation

## 2012-12-30 DIAGNOSIS — Z9889 Other specified postprocedural states: Secondary | ICD-10-CM | POA: Insufficient documentation

## 2012-12-30 NOTE — ED Notes (Signed)
Pt alert, arrives from home, c/o right lower abd pain, onset was 4 days ago, describes as an ache, resp even unlabored, skin pwd, states "feels like its a knot"

## 2012-12-31 ENCOUNTER — Emergency Department (HOSPITAL_COMMUNITY): Payer: Self-pay

## 2012-12-31 LAB — LIPASE, BLOOD: Lipase: 22 U/L (ref 11–59)

## 2012-12-31 LAB — COMPREHENSIVE METABOLIC PANEL
Albumin: 3.3 g/dL — ABNORMAL LOW (ref 3.5–5.2)
BUN: 13 mg/dL (ref 6–23)
CO2: 28 mEq/L (ref 19–32)
Calcium: 8.9 mg/dL (ref 8.4–10.5)
Chloride: 102 mEq/L (ref 96–112)
Creatinine, Ser: 0.83 mg/dL (ref 0.50–1.10)
GFR calc non Af Amer: >90 mL/min (ref >90)
Glucose, Bld: 91 mg/dL (ref 70–99)
Total Protein: 7.4 g/dL (ref 6.0–8.3)

## 2012-12-31 LAB — URINALYSIS, ROUTINE W REFLEX MICROSCOPIC
Glucose, UA: NEGATIVE mg/dL
Hgb urine dipstick: NEGATIVE
Ketones, ur: NEGATIVE mg/dL
Specific Gravity, Urine: 1.036 — ABNORMAL HIGH (ref 1.005–1.030)
Urobilinogen, UA: 1 mg/dL (ref 0.0–1.0)
pH: 7 (ref 5.0–8.0)

## 2012-12-31 LAB — CBC WITH DIFFERENTIAL/PLATELET
Basophils Absolute: 0 10*3/uL (ref 0.0–0.1)
Eosinophils Relative: 3 % (ref 0–5)
HCT: 38.6 % (ref 36.0–46.0)
Monocytes Absolute: 1 10*3/uL (ref 0.1–1.0)
Monocytes Relative: 10 % (ref 3–12)
Neutro Abs: 6.1 10*3/uL (ref 1.7–7.7)
Neutrophils Relative %: 64 % (ref 43–77)
Platelets: 349 10*3/uL (ref 150–400)
RDW: 15.6 % — ABNORMAL HIGH (ref 11.5–15.5)

## 2012-12-31 LAB — WET PREP, GENITAL
Clue Cells Wet Prep HPF POC: NONE SEEN
WBC, Wet Prep HPF POC: NONE SEEN
Yeast Wet Prep HPF POC: NONE SEEN

## 2012-12-31 LAB — URINE MICROSCOPIC-ADD ON

## 2012-12-31 MED ORDER — HYDROCODONE-ACETAMINOPHEN 5-325 MG PO TABS: 1.0000 | ORAL_TABLET | ORAL | Status: DC | PRN

## 2012-12-31 MED ORDER — IOHEXOL 300 MG/ML  SOLN
100.0000 mL | Freq: Once | INTRAMUSCULAR | Status: AC | PRN
  Administered 2012-12-31: 100 mL via INTRAVENOUS

## 2012-12-31 MED ORDER — MORPHINE SULFATE 4 MG/ML IJ SOLN
4.0000 mg | Freq: Once | INTRAMUSCULAR | Status: AC
  Administered 2012-12-31: 4 mg via INTRAVENOUS
  Filled 2012-12-31 (×2): qty 1

## 2012-12-31 NOTE — ED Provider Notes (Signed)
History     CSN: [id number]  Arrival date & time 12/30/12  2204   First MD Initiated Contact with Patient 12/31/12 0309      Chief Complaint  Patient presents with  . Abdominal Pain    (Consider location/radiation/quality/duration/timing/severity/associated sxs/prior treatment) HPI History provided by pt.   Pt c/o RLQ pain for the past 2 weeks.  She feels a knot under her skin and this exactly where pain is located.Constant, achy sensation that is aggravated by palpation.  Associated w/ dyspareunia only.  Denies fever, anorexia, N/V/D, hematochezia/melena and other GU sx.  Past abd surgeries include c-section.   History reviewed. No pertinent past medical history.  Past Surgical History  Procedure Date  . Cesarean section     History reviewed. No pertinent family history.  History  Substance Use Topics  . Smoking status: Not on file  . Smokeless tobacco: Not on file  . Alcohol Use: Not on file    OB History    Grav Para Term Preterm Abortions TAB SAB Ect Mult Living                  Review of Systems  All other systems reviewed and are negative.    Allergies  Codeine  Home Medications  No current outpatient prescriptions on file.  BP 113/62  Pulse 71  Temp 98.6 F (37 C) (Oral)  Resp 18  Wt 200 lb (90.719 kg)  SpO2 99%  LMP 12/18/2012  Physical Exam  Nursing note and vitals reviewed. Constitutional: She is oriented to person, place, and time. She appears well-developed and well-nourished. No distress.  HENT:  Head: Normocephalic and atraumatic.  Eyes:       Normal appearance  Neck: Normal range of motion.  Cardiovascular: Normal rate and regular rhythm.   Pulmonary/Chest: Effort normal and breath sounds normal. No respiratory distress.  Abdominal: Soft. Bowel sounds are normal. She exhibits no distension and no mass. There is no rebound and no guarding.       No palpable mass but there is mildly lumpy tissue when you squeeze pannus in RLQ.  Pt  verifies that this is the location of her pain.  Otherwise, no tenderness of RLQ or anywhere else in abdomen.   Genitourinary:       No CVA tenderness.  Nml external genitalia.  Small amt thin, white discharge.  Cervix closed and appears nml.  No adnexal or cervical motion tenderess.    Musculoskeletal: Normal range of motion.  Neurological: She is alert and oriented to person, place, and time.  Skin: Skin is warm and dry. No rash noted.  Psychiatric: She has a normal mood and affect. Her behavior is normal.    ED Course  Procedures (including critical care time)  Labs Reviewed  CBC WITH DIFFERENTIAL - Abnormal; Notable for the following:    RDW 15.6 (*)     All other components within normal limits  COMPREHENSIVE METABOLIC PANEL - Abnormal; Notable for the following:    Albumin 3.3 (*)     Total Bilirubin 0.1 (*)     All other components within normal limits  URINALYSIS, ROUTINE W REFLEX MICROSCOPIC - Abnormal; Notable for the following:    APPearance CLOUDY (*)     Specific Gravity, Urine 1.036 (*)     Protein, ur 30 (*)     Leukocytes, UA SMALL (*)     All other components within normal limits  URINE MICROSCOPIC-ADD ON - Abnormal; Notable for the following:  Squamous Epithelial / LPF MANY (*)     Bacteria, UA FEW (*)     All other components within normal limits  LIPASE, BLOOD  PREGNANCY, URINE  WET PREP, GENITAL  GC/CHLAMYDIA PROBE AMP  URINE CULTURE   Ct Abdomen Pelvis W Contrast  12/31/2012  *RADIOLOGY REPORT*  Clinical Data: Right-sided abdominal pain.  CT ABDOMEN AND PELVIS WITH CONTRAST  Technique:  Multidetector CT imaging of the abdomen and pelvis was performed following the standard protocol during bolus administration of intravenous contrast.  Contrast: OMNIPAQUE IOHEXOL 300 MG/ML  SOLN  Comparison: None.  Findings: Lung bases are clear.  No effusions.  Heart is normal size.  Liver, gallbladder, stomach, spleen, pancreas, adrenals and kidneys are normal.   Appendix is visualized and is normal.  Uterus, adnexa and urinary bladder grossly unremarkable.  Stomach, large and small bowel grossly unremarkable.  No free fluid, free air or adenopathy.  No acute bony abnormality.  IMPRESSION: No acute findings in the abdomen or pelvis.  Normal appendix.   Original Report Authenticated By: Charlett Nose, M.D.      1. Abdominal pain       MDM  31yo obese but otherwise healthy F presents w/ painful knot of RLQ w/out associated sx x 2 weeks.  On exam, afebrile, mildly uncomfortable appearing, lumpy and tender tissue when panus of RLQ pinched, but otherwise benign abd, unremarkable genitalia.  Labs unremarkable.  CT ordered to r/o atypical presentation of appendicitis, particularly because patient a poor historian.  Negative for acute pathology.  Pt has been reassured and I explained that the mass she is feeling is likely adipose tissue.  D/c'd home w/ short course of vicodin.  Return precautions discussed.         Otilio Miu, PA-C 12/31/12 2314

## 2012-12-31 NOTE — ED Notes (Signed)
Pt. Refused for Morphine IV inj . To notify PA.

## 2013-01-01 LAB — URINE CULTURE

## 2013-01-02 NOTE — ED Provider Notes (Signed)
Medical screening examination/treatment/procedure(s) were performed by non-physician practitioner and as supervising physician I was immediately available for consultation/collaboration.  Raeford Razor, MD 01/02/13 2100

## 2013-01-02 NOTE — ED Notes (Signed)
+   Urine Chart sent to EDP office for review. 

## 2013-01-04 NOTE — ED Notes (Signed)
Chart returned from EDP office. Per Christopher Lawyer PA-C, "Done". °

## 2016-02-17 ENCOUNTER — Encounter (HOSPITAL_COMMUNITY): Payer: Self-pay | Admitting: Emergency Medicine

## 2016-02-17 ENCOUNTER — Emergency Department (HOSPITAL_COMMUNITY)
Admission: EM | Admit: 2016-02-17 | Discharge: 2016-02-17 | Disposition: A | Discharge status: Home / Self Care | Payer: Self-pay | Attending: Emergency Medicine | Admitting: Emergency Medicine

## 2016-02-17 DIAGNOSIS — F4329 Adjustment disorder with other symptoms: Secondary | ICD-10-CM | POA: Diagnosis present

## 2016-02-17 DIAGNOSIS — F1210 Cannabis abuse, uncomplicated: Secondary | ICD-10-CM | POA: Insufficient documentation

## 2016-02-17 DIAGNOSIS — Z79899 Other long term (current) drug therapy: Secondary | ICD-10-CM | POA: Insufficient documentation

## 2016-02-17 DIAGNOSIS — Z3202 Encounter for pregnancy test, result negative: Secondary | ICD-10-CM | POA: Insufficient documentation

## 2016-02-17 DIAGNOSIS — F4325 Adjustment disorder with mixed disturbance of emotions and conduct: Secondary | ICD-10-CM | POA: Insufficient documentation

## 2016-02-17 DIAGNOSIS — F22 Delusional disorders: Secondary | ICD-10-CM | POA: Insufficient documentation

## 2016-02-17 LAB — CBC
HCT: 42.8 % (ref 36.0–46.0)
HEMOGLOBIN: 14 g/dL (ref 12.0–15.0)
MCH: 28.7 pg (ref 26.0–34.0)
MCHC: 32.7 g/dL (ref 30.0–36.0)
MCV: 87.7 fL (ref 78.0–100.0)
Platelets: 328 10*3/uL (ref 150–400)
RBC: 4.88 MIL/uL (ref 3.87–5.11)
RDW: 14.2 % (ref 11.5–15.5)
WBC: 7.3 10*3/uL (ref 4.0–10.5)

## 2016-02-17 LAB — COMPREHENSIVE METABOLIC PANEL
ALT: 18 U/L (ref 14–54)
AST: 20 U/L (ref 15–41)
Albumin: 4.2 g/dL (ref 3.5–5.0)
Alkaline Phosphatase: 67 U/L (ref 38–126)
Anion gap: 10 (ref 5–15)
BUN: 8 mg/dL (ref 6–20)
CHLORIDE: 108 mmol/L (ref 101–111)
CO2: 21 mmol/L — AB (ref 22–32)
CREATININE: 0.91 mg/dL (ref 0.44–1.00)
Calcium: 8.8 mg/dL — ABNORMAL LOW (ref 8.9–10.3)
GFR calc non Af Amer: >60 mL/min (ref >60)
Glucose, Bld: 111 mg/dL — ABNORMAL HIGH (ref 65–99)
POTASSIUM: 3.1 mmol/L — AB (ref 3.5–5.1)
Sodium: 139 mmol/L (ref 135–145)
Total Bilirubin: 0.6 mg/dL (ref 0.3–1.2)
Total Protein: 7.7 g/dL (ref 6.5–8.1)

## 2016-02-17 LAB — ETHANOL: Alcohol, Ethyl (B): <5 mg/dL (ref <5)

## 2016-02-17 LAB — I-STAT BETA HCG BLOOD, ED (MC, WL, AP ONLY): I-stat hCG, quantitative: <5 m[IU]/mL (ref <5)

## 2016-02-17 LAB — RAPID URINE DRUG SCREEN, HOSP PERFORMED
AMPHETAMINES: NOT DETECTED
BENZODIAZEPINES: NOT DETECTED
Barbiturates: NOT DETECTED
COCAINE: NOT DETECTED
OPIATES: NOT DETECTED
TETRAHYDROCANNABINOL: POSITIVE — AB

## 2016-02-17 LAB — ACETAMINOPHEN LEVEL: Acetaminophen (Tylenol), Serum: <10 ug/mL — ABNORMAL LOW (ref 10–30)

## 2016-02-17 LAB — SALICYLATE LEVEL

## 2016-02-17 MED ORDER — ZOLPIDEM TARTRATE 5 MG PO TABS
5.0000 mg | ORAL_TABLET | Freq: Every evening | ORAL | Status: DC | PRN
Start: 2016-02-17 — End: 2016-02-17

## 2016-02-17 MED ORDER — POTASSIUM CHLORIDE 10 MEQ/100ML IV SOLN: 10.0000 meq | Freq: Once | INTRAVENOUS | Status: DC

## 2016-02-17 MED ORDER — POTASSIUM CHLORIDE CRYS ER 20 MEQ PO TBCR
60.0000 meq | EXTENDED_RELEASE_TABLET | Freq: Once | ORAL | Status: AC
  Administered 2016-02-17: 60 meq via ORAL
  Filled 2016-02-17: qty 3

## 2016-02-17 MED ORDER — ONDANSETRON 4 MG PO TBDP
4.0000 mg | ORAL_TABLET | Freq: Three times a day (TID) | ORAL | Status: DC | PRN
Start: 2016-02-17 — End: 2016-02-17

## 2016-02-17 MED ORDER — POTASSIUM CHLORIDE CRYS ER 10 MEQ PO TBCR: 10.0000 meq | EXTENDED_RELEASE_TABLET | Freq: Every day | ORAL | Status: DC

## 2016-02-17 MED ORDER — ONDANSETRON 4 MG PO TBDP
4.0000 mg | ORAL_TABLET | Freq: Once | ORAL | Status: AC
  Administered 2016-02-17: 4 mg via ORAL
  Filled 2016-02-17: qty 1

## 2016-02-17 MED ORDER — ONDANSETRON 4 MG PO TBDP: 4.0000 mg | ORAL_TABLET | Freq: Three times a day (TID) | ORAL | Status: DC | PRN

## 2016-02-17 NOTE — Discharge Instructions (Signed)
Nanine Means, DNP recommends patient follow up with outpatient services after discharge. Patient provided with following resources and education:  [facility] (Medication management, therapy, and crisis services) ACCESS LINE:  [phone] or [phone] [facility] [address] [place], [place] [address]  [facility] (Medication management and therapy) [address] [place], [place] [address] [phone]  [facility] (Medication management and therapy) [address] [place], [place] [address] [phone]  THERAPISTS (No medication management)  [facility]  [facility] Call for location of [place] office  [address] [phone]      [place], [place] [address]        [phone]  MOBILE CRISIS TEAMS  [facility] [phone]   These referrals have been provided to you as appropriate for your clinical needs while taking into account your financial concerns. Please be aware that agencies, practitioners and insurance companies sometimes change contracts. When calling to make an appointment have your insurance information available so the professional you are going to see can confirm whether they are covered by your plan. Take this form with you in case the person you are seeing needs a copy or to contact us.
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
## 2016-02-17 NOTE — BH Assessment (Signed)
Spoke with patients mother in another room. Patients mother states that the patient is overwhelmed due to her relationship with her husband.  Patients mother states that the patient lost her job about one year ago and then she lost her car. She states that the patient depends on her husband who works part time. Patients mother states that the patients husband has always worked part time but "didn't step it up when she lost her job and he should have." She states that the patient cleans the home and cares for the two children. She states that the patient has not been getting any help from her husband financially or emotionally and she feels that the patient "just needs to be in a calm space with her kids." Patients mother states that the patient and her children are welcome at her home. Patients mother states that she feels that the patient is safe and would not hurt herself or anyone else. Patients mother states that she has not noticed the person talking to anyone who is not there or responding to things that other people don't see.  Patients mother states that the patient "loves her kids more than anything, she would do anything for them." Patients mother states that she feels that the patient being with her will be helpful. Informed PMH-NP of this information who spoke with the patient face-to-face as well.    Davina Poke, LCSW Therapeutic Triage Specialist [facility] 02/17/2016 5:38 PM

## 2016-02-17 NOTE — BH Assessment (Addendum)
Assessment completed. Consulted with Nanine Means, DNP who recommends patient be discharged with outpatient resources.    Davina Poke, LCSW Therapeutic Triage Specialist [facility] 02/17/2016 4:11 PM

## 2016-02-17 NOTE — ED Notes (Signed)
Pt oriented to room and unit.  Pt appears very delusional thinking that she was blinded when her husband looked at her.  She also claimed to have gone to a mental health facility and knew the workers even though she had never met them.  She believes her husband is either trying to make her crazy or kill her.  15 minute checks and video monitoring in place.

## 2016-02-17 NOTE — BH Assessment (Signed)
Patient recommended for discharge by Nanine Means, DNP. Patient provided with outpatient resources and encouraged to walk-in to [facility] or [facility] of the [place] in the morning.  Patient states that she will follow up with a psychiatrist for medication management and may seek therapy.    Davina Poke, LCSW Therapeutic Triage Specialist [facility] 02/17/2016 5:39 PM

## 2016-02-17 NOTE — ED Notes (Signed)
Pt belongings sent home with mother. 
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
## 2016-02-17 NOTE — ED Notes (Signed)
Pt discharged ambulatory with RX given and reviewed.  Pt was discharged with family.  Pt was in no distress at discharge.
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
## 2016-02-17 NOTE — ED Notes (Signed)
Pt stated unable to give urine sample at this time 
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
## 2016-03-10 ENCOUNTER — Inpatient Hospital Stay (HOSPITAL_COMMUNITY)
Admission: RE | Admit: 2016-03-10 | Discharge: 2016-03-18 | DRG: 885 | Disposition: A | Discharge status: Home / Self Care | Payer: Self-pay | Attending: Psychiatry | Admitting: Psychiatry

## 2016-03-10 ENCOUNTER — Encounter (HOSPITAL_COMMUNITY): Payer: Self-pay | Admitting: *Deleted

## 2016-03-10 DIAGNOSIS — F39 Unspecified mood [affective] disorder: Secondary | ICD-10-CM | POA: Diagnosis present

## 2016-03-10 DIAGNOSIS — F3164 Bipolar disorder, current episode mixed, severe, with psychotic features: Principal | ICD-10-CM | POA: Diagnosis present

## 2016-03-10 DIAGNOSIS — F1220 Cannabis dependence, uncomplicated: Secondary | ICD-10-CM | POA: Diagnosis present

## 2016-03-10 DIAGNOSIS — F29 Unspecified psychosis not due to a substance or known physiological condition: Secondary | ICD-10-CM | POA: Diagnosis present

## 2016-03-10 DIAGNOSIS — G4700 Insomnia, unspecified: Secondary | ICD-10-CM | POA: Diagnosis present

## 2016-03-10 DIAGNOSIS — Z79899 Other long term (current) drug therapy: Secondary | ICD-10-CM

## 2016-03-10 DIAGNOSIS — F419 Anxiety disorder, unspecified: Secondary | ICD-10-CM | POA: Diagnosis present

## 2016-03-10 DIAGNOSIS — Z915 Personal history of self-harm: Secondary | ICD-10-CM

## 2016-03-10 LAB — COMPREHENSIVE METABOLIC PANEL
ALBUMIN: 3.9 g/dL (ref 3.5–5.0)
ALT: 16 U/L (ref 14–54)
AST: 19 U/L (ref 15–41)
Alkaline Phosphatase: 57 U/L (ref 38–126)
Anion gap: 10 (ref 5–15)
BUN: 12 mg/dL (ref 6–20)
CHLORIDE: 105 mmol/L (ref 101–111)
CO2: 26 mmol/L (ref 22–32)
CREATININE: 0.98 mg/dL (ref 0.44–1.00)
Calcium: 9 mg/dL (ref 8.9–10.3)
GFR calc Af Amer: >60 mL/min (ref >60)
GLUCOSE: 99 mg/dL (ref 65–99)
Potassium: 3 mmol/L — ABNORMAL LOW (ref 3.5–5.1)
SODIUM: 141 mmol/L (ref 135–145)
Total Bilirubin: 0.7 mg/dL (ref 0.3–1.2)
Total Protein: 7.4 g/dL (ref 6.5–8.1)

## 2016-03-10 LAB — CBC
HCT: 39.9 % (ref 36.0–46.0)
Hemoglobin: 13 g/dL (ref 12.0–15.0)
MCH: 29.3 pg (ref 26.0–34.0)
MCHC: 32.6 g/dL (ref 30.0–36.0)
MCV: 89.9 fL (ref 78.0–100.0)
PLATELETS: 381 10*3/uL (ref 150–400)
RBC: 4.44 MIL/uL (ref 3.87–5.11)
RDW: 15.1 % (ref 11.5–15.5)
WBC: 7.8 10*3/uL (ref 4.0–10.5)

## 2016-03-10 LAB — ETHANOL: Alcohol, Ethyl (B): <5 mg/dL (ref <5)

## 2016-03-10 LAB — TSH: TSH: 0.447 u[IU]/mL (ref 0.350–4.500)

## 2016-03-10 MED ORDER — MAGNESIUM HYDROXIDE 400 MG/5ML PO SUSP: 30.0000 mL | Freq: Every day | ORAL | Status: DC | PRN

## 2016-03-10 MED ORDER — TRAZODONE HCL 50 MG PO TABS
50.0000 mg | ORAL_TABLET | Freq: Every evening | ORAL | Status: DC | PRN
  Filled 2016-03-10 (×4): qty 1

## 2016-03-10 MED ORDER — LORAZEPAM 2 MG/ML IJ SOLN: 1.0000 mg | Freq: Four times a day (QID) | INTRAMUSCULAR | Status: DC | PRN

## 2016-03-10 MED ORDER — LORAZEPAM 1 MG PO TABS
1.0000 mg | ORAL_TABLET | Freq: Four times a day (QID) | ORAL | Status: DC | PRN
  Administered 2016-03-10 – 2016-03-18 (×14): 1 mg via ORAL
  Filled 2016-03-10 (×14): qty 1

## 2016-03-10 MED ORDER — ALUM & MAG HYDROXIDE-SIMETH 200-200-20 MG/5ML PO SUSP: 30.0000 mL | ORAL | Status: DC | PRN

## 2016-03-10 MED ORDER — OLANZAPINE 10 MG PO TBDP
10.0000 mg | ORAL_TABLET | Freq: Three times a day (TID) | ORAL | Status: DC | PRN
  Administered 2016-03-10 – 2016-03-15 (×6): 10 mg via ORAL
  Filled 2016-03-10 (×6): qty 1

## 2016-03-10 MED ORDER — OLANZAPINE 10 MG IM SOLR: 10.0000 mg | Freq: Three times a day (TID) | INTRAMUSCULAR | Status: DC | PRN

## 2016-03-10 MED ORDER — ACETAMINOPHEN 325 MG PO TABS: 650.0000 mg | ORAL_TABLET | Freq: Four times a day (QID) | ORAL | Status: DC | PRN

## 2016-03-10 NOTE — Progress Notes (Signed)
Admission Note:  D22- 34 yr old female who presents no acute distress for the treatment of psychosis.  Patient appears to responding to internal stimuli and is observed laughing inappropriately.  Patient became agitated and verbally aggressive during the admission process.  Patient refused to let staff complete vital sign collection.  Patient refused to answer any questions. Patient was verbally deescalated and escorted to the unit. A- Skin was assessed by Inetta Fermoina, RN on the unit.  Patient searched and no contraband found.  Consents obtained.  R- Pt had no additional questions or concerns.

## 2016-03-10 NOTE — BH Assessment (Addendum)
Assessment Note  [name] is an 35 y.o. female who presented voluntarily to [facility] as a walk in, accompanied by her parents, Charlott Rakes & Mercie Eon. Pt was clearly experiencing an extremely manic episode AEB her pressured, loud, aggressive speech and her hyperactive gestures. Pt was also very labile, moving from angry to tearful to silly. Pt was unable to complete an assessment due to her mental state. All information rec'd from pt's parents.   Parents shared that pt has never taken any psych medication and has never had any psych f/u. Parents also reported that the onset of pt's symptoms began in adulthood and has gotten increasingly worse. Pt has manic episodes daily. Pt is married with two children (10 and 27 yr old). Pt's husband is supportive but is at a lost of how to handle pt and he calls parents to help intervene. Parents indicated that pt has used (unspecified) drugs in the past, but is unsure if she is still using.    Diagnosis: Bipolar I disorder, Current or most recent episode manic, severe, provisional  Past Medical History: No past medical history on file.  Past Surgical History  Procedure Laterality Date  . Cesarean section      Family History: No family history on file.  Social History:  has no tobacco, alcohol, and drug history on file.  Additional Social History:  Alcohol / Drug Use Pain Medications: none reported Prescriptions: none reported Over the Counter: none reported History of alcohol / drug use?:  (parents report hx of use, but no abuse; not sure if currently using)  CIWA:   COWS:    Allergies:  Allergies  Allergen Reactions  . Codeine Nausea And Vomiting    Home Medications:  Medications Prior to Admission  Medication Sig Dispense Refill  . ondansetron (ZOFRAN-ODT) 4 MG disintegrating tablet Take 1 tablet (4 mg total) by mouth every 8 (eight) hours as needed for nausea or vomiting. 20 tablet 0    OB/GYN Status:  No LMP recorded.  General  Assessment Data Location of Assessment: [facility] TTS Assessment: In system Is this a Tele or Face-to-Face Assessment?: Face-to-Face Is this an Initial Assessment or a Re-assessment for this encounter?: Initial Assessment Marital status: Married Is patient pregnant?: No Pregnancy Status: No Living Arrangements: Spouse/significant other, Children Can pt return to current living arrangement?: Yes Admission Status: Voluntary Is patient capable of signing voluntary admission?: Yes Referral Source: Self/Family/Friend Insurance type: none  Medical Screening Exam [facility] Walk-in ONLY) Medical Exam completed: No Reason for MSE not completed: Other: (pt admitted to unit. Will receive MSE)  Crisis Care Plan Living Arrangements: Spouse/significant other, Children Name of Psychiatrist: none Name of Therapist: none  Education Status Is patient currently in school?: No  Risk to self with the past 6 months Suicidal Ideation: No Has patient been a risk to self within the past 6 months prior to admission? : No Suicidal Intent: No Has patient had any suicidal intent within the past 6 months prior to admission? : No Is patient at risk for suicide?: No Suicidal Plan?: No Has patient had any suicidal plan within the past 6 months prior to admission? : No Access to Means: No What has been your use of drugs/alcohol within the last 12 months?: unknown Previous Attempts/Gestures: No Intentional Self Injurious Behavior: None Family Suicide History: Unknown Persecutory voices/beliefs?: No Depression: No Substance abuse history and/or treatment for substance abuse?: No Suicide prevention information given to non-admitted patients: Not applicable  Risk to  Others within the past 6 months Homicidal Ideation: No Does patient have any lifetime risk of violence toward others beyond the six months prior to admission? : No Thoughts of Harm to Others: Yes-Currently Present Comment - Thoughts  of Harm to Others: pt's parents reports pt making threats of violence towards others Current Homicidal Intent: No Current Homicidal Plan: No Access to Homicidal Means: No History of harm to others?: No Assessment of Violence: On admission Violent Behavior Description: Pt has aggressive speech and gestures.  (No actual violence noted.) Does patient have access to weapons?: No Criminal Charges Pending?: No Does patient have a court date: No Is patient on probation?: No  Psychosis Hallucinations: None noted Delusions: Unspecified  Mental Status Report Appearance/Hygiene: Disheveled, Bizarre Eye Contact: Good Motor Activity: Hyperactivity Speech: Abusive, Aggressive, Loud Level of Consciousness: Alert Mood: Labile Affect: Labile, Appropriate to circumstance Anxiety Level: None Thought Processes: Flight of Ideas, Tangential, Irrelevant Judgement: Unable to Assess Orientation: Unable to assess Obsessive Compulsive Thoughts/Behaviors: Unable to Assess  Cognitive Functioning Concentration: Decreased Memory: Unable to Assess IQ: Average Insight: Unable to Assess Impulse Control: Unable to Assess Sleep: Unable to Assess Vegetative Symptoms: Unable to Assess  ADLScreening [facility] Assessment Services) Patient's cognitive ability adequate to safely complete daily activities?: Yes  Prior Inpatient Therapy Prior Inpatient Therapy: No  Prior Outpatient Therapy Prior Outpatient Therapy: No Does patient have an ACCT team?: No Does patient have Intensive In-House Services?  : No Does patient have [facility] services? : No Does patient have P4CC services?: No  ADL Screening (condition at time of admission) Patient's cognitive ability adequate to safely complete daily activities?: Yes Is the patient deaf or have difficulty hearing?: No Does the patient have difficulty seeing, even when wearing glasses/contacts?: No Does the patient have difficulty concentrating, remembering, or making  decisions?: Yes Does the patient have difficulty dressing or bathing?: No Does the patient have difficulty walking or climbing stairs?: No Weakness of Legs: None Weakness of Arms/Hands: None  Home Assistive Devices/Equipment Home Assistive Devices/Equipment: None  Therapy Consults (therapy consults require a physician order) PT Evaluation Needed: No OT Evalulation Needed: No SLP Evaluation Needed: No Abuse/Neglect Assessment (Assessment to be complete while patient is alone) Physical Abuse: Denies Verbal Abuse: Denies Sexual Abuse: Denies Exploitation of patient/patient's resources: Denies Self-Neglect: Denies Values / Beliefs Cultural Requests During Hospitalization: None Spiritual Requests During Hospitalization: None Consults Spiritual Care Consult Needed: No Social Work Consult Needed: No Merchant navy officerAdvance Directives (For Healthcare) Does patient have an advance directive?: No Would patient like information on creating an advanced directive?: No - patient declined information    Additional Information 1:1 In Past 12 Months?: No CIRT Risk: No Elopement Risk: No Does patient have medical clearance?: Yes     Disposition:  Disposition Initial Assessment Completed for this Encounter: Yes Disposition of Patient: Inpatient treatment program (accepted by Dr. Elna BreslowEappen) Type of inpatient treatment program: Adult ([facility]  On Site Evaluation by:   Reviewed with Physician:    Laddie AquasSamantha M Yasaman Kolek 03/10/2016 3:14 PM
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
## 2016-03-11 ENCOUNTER — Encounter (HOSPITAL_COMMUNITY): Payer: Self-pay | Admitting: Psychiatry

## 2016-03-11 DIAGNOSIS — F3164 Bipolar disorder, current episode mixed, severe, with psychotic features: Principal | ICD-10-CM

## 2016-03-11 DIAGNOSIS — F1220 Cannabis dependence, uncomplicated: Secondary | ICD-10-CM

## 2016-03-11 LAB — LIPID PANEL
CHOL/HDL RATIO: 4.6 ratio
CHOLESTEROL: 137 mg/dL (ref 0–200)
HDL: 30 mg/dL — ABNORMAL LOW (ref >40)
LDL CALC: 78 mg/dL (ref 0–99)
Triglycerides: 146 mg/dL (ref <150)
VLDL: 29 mg/dL (ref 0–40)

## 2016-03-11 LAB — RAPID URINE DRUG SCREEN, HOSP PERFORMED
Amphetamines: NOT DETECTED
Barbiturates: NOT DETECTED
Benzodiazepines: POSITIVE — AB
Cocaine: NOT DETECTED
OPIATES: NOT DETECTED
Tetrahydrocannabinol: POSITIVE — AB

## 2016-03-11 LAB — PREGNANCY, URINE: Preg Test, Ur: NEGATIVE

## 2016-03-11 MED ORDER — QUETIAPINE FUMARATE 50 MG PO TABS
50.0000 mg | ORAL_TABLET | Freq: Every day | ORAL | Status: DC
  Administered 2016-03-11: 50 mg via ORAL
  Filled 2016-03-11 (×3): qty 1

## 2016-03-11 MED ORDER — TRAZODONE HCL 50 MG PO TABS
50.0000 mg | ORAL_TABLET | Freq: Every evening | ORAL | Status: DC | PRN
  Administered 2016-03-12: 50 mg via ORAL

## 2016-03-11 MED ORDER — LITHIUM CARBONATE 300 MG PO CAPS
300.0000 mg | ORAL_CAPSULE | Freq: Two times a day (BID) | ORAL | Status: DC
  Administered 2016-03-11 – 2016-03-17 (×12): 300 mg via ORAL
  Filled 2016-03-11 (×16): qty 1

## 2016-03-11 MED ORDER — POTASSIUM CHLORIDE CRYS ER 20 MEQ PO TBCR
20.0000 meq | EXTENDED_RELEASE_TABLET | Freq: Two times a day (BID) | ORAL | Status: AC
  Administered 2016-03-11 – 2016-03-13 (×4): 20 meq via ORAL
  Filled 2016-03-11 (×5): qty 1
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
## 2016-03-11 NOTE — [facility] Group Notes (Signed)
[facility] LCSW Group Therapy  03/11/2016 1:55 PM  Type of Therapy: Group Therapy  Participation Level:Invited. Chose not to attend.   Summary of Progress/Problems: Onalee HuaDavid from [employer] was here to tell his story of recovery and play his guitar.  Vito BackersLynn B. Beverely PaceBryant 03/11/2016 1:55 PM

## 2016-03-11 NOTE — Tx Team (Signed)
Interdisciplinary Treatment Plan Update (Adult)  Date:  03/11/2016 Time Reviewed:  1:56 PM  Progress in Treatment: Attending groups: No, pt new to unit. Participating in groups: No, CSW still assessing. Taking medication as prescribed:  Yes. Tolerating medication:  Yes. Family/Significant othe contact made:  Yes, individual(s) contacted:  Latanya Maudlin (husband) [phone]  Patient understands diagnosis: No, limited insight Discussing patient identified problems/goals with staff:  Yes, see initial care plan. Medical problems stabilized or resolved:  Yes Denies suicidal/homicidal ideation: Yes. Issues/concerns per patient self-inventory: No. Other:  New problem(s) identified:  Latanya Maudlin was contacted and informed CSW that he is not pt's husband. He is an ex-coworker that knows pt minimally. [name] explained that pt has a delusion that he is her husband. [name] would like to be removed from any further contact.  Discharge Plan or Barriers: See below  Reason for Continuation of Hospitalization: Anxiety Hallucinations Medication stabilization Mood instability  Comments: [name] is a 35 y.o. AA female , who is married , employed , lives with her husband in [place] , denies past hx of mental illness, who presented voluntarily to [name] as a walk in, accompanied by her parents, Ian Bushman & Neale Burly for aggressive, bizarre behavior at home. Lithium, Seroquel trial   Estimated length of stay: 4-5 days  New goal(s):  Review of initial/current patient goals per problem list:  1. Goal(s): Patient will participate in aftercare plan  Met:Yes  Target date: at discharge  As evidenced by: Patient will participate within aftercare plan AEB aftercare provider and housing plan at discharge being identified.  03/11/16: Pt will return home and follow-up oupt     6. Goal (s): Patient will demonstrate decreased signs of mania  * Met: No  * Target date: 3-5  days post admission date  * As evidenced by: Patient demonstrate decreased signs of mania AEB decreased mood instability and demonstration of stable mood   03/11/16: Pt presents with pressured speech, flight of ideas, labile mood. Pt also endorses AVH and is experiencing delusions.    Attendees: Patient:  03/11/2016 1:56 PM  Family:   03/11/2016 1:56 PM  Physician:  Dr. Ursula Alert, MD 03/11/2016 1:56 PM  Nursing: Larrie Kass., RN 03/11/2016 1:56 PM  Case Manager:  Roque Lias, LCSW 03/11/2016 1:56 PM  Counselor:  Matthew Saras, MSW Intern 03/11/2016 1:56 PM  Other:   03/11/2016 1:56 PM  Other:   03/11/2016 1:56 PM  Other:   03/11/2016 1:56 PM  Other:  03/11/2016 1:56 PM  Other:    Other:    Other:    Other:    Other:    Other:      Scribe for Treatment Team:   Georga Kaufmann, MSW Intern 03/11/2016 1:56 PM

## 2016-03-11 NOTE — Progress Notes (Signed)
D. Pt in room and in bed much of the evening, did awake around 0130 and spoke about how she is ready to leave and asked about why she is here, pt then asked about seeing the doctor in regards to discharge and was explained that doctor would be in to see her in the morning time. Pt initially seemed bothered by that but with support and encouragement pt was able to calm down. Pt did request and receive something to drink as well as something to eat and did receive Ativan 1 mg without incident, however the focus of any conversation thereafter was in regards to her not knowing why she was here and wanting to go home. A. Support provided, medication administered. R. Safety maintained at the present time, will continue to monitor.

## 2016-03-11 NOTE — Progress Notes (Signed)
Patient ID: [name], female   DOB: 09-Aug-1981, 35 y.o.   MRN: [id number] D: Client visible on the unit, in dayroom watching TV, also has loud outburst, then crying spells, paranoid. "I feel like people talking about me" Client is tangential and confused, jumps for one incomplete thought to another. A: Writer redirected several times. Medications reviewed and administered as ordered. Staff will monitor q3815min for safety. R:Client is safe on the unit, attended group.

## 2016-03-11 NOTE — Progress Notes (Addendum)
D: Pt observed talking to self in her room at intervals during shift. Pt was singing loudly this AM, pacing in her room and was verbally abusive towards peers jokingly. Pt is disorganized with rapid and pressured speech with loud inappropriate laughter at intervals. Mood remains labile. Pt required increased verbal encouragement and prompts to comply with scheduled medications and EKG procedure. Denies SI, HI, AVH and pain when assessed. Pt had to be redirected to not go into peers' rooms as she did once this shift, understanding verbalized.  A: Scheduled and PRN medications administered [facility]. EKG done and urine sample obtained as ordered. Support, availability and encouragement provided to pt. Q 15 minutes checks maintained on unit without physical outburst or self harm gestures to note thus far this shift. R: Pt appears calmer and more cooperative with care when reassessed in comparison to this AM. POC continues.

## 2016-03-11 NOTE — Plan of Care (Signed)
Problem: Ineffective individual coping Goal: STG: Patient will remain free from self harm Outcome: Progressing Q 15 minutes checks maintained for safety without self harm behavior or physical outburst to note at this time.   Problem: Alteration in thought process Goal: STG-Patient does not respond to command hallucinations Outcome: Not Progressing Pt is responding to internal stimuli--observed talking to self with inappropriate laughter at times.

## 2016-03-11 NOTE — Clinical Social Work Note (Signed)
Clinical Social Work Assessment  Called [name] [phone] at 4:50pm. Pt listed [name] as her husband whom she has been married to for 14 years. Mr. [name] stated that he is not pt's husband and that he does not even know her that well. 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 Pt has been harassing Mr. [name] and has a delusion that they are married to one another. Mr. [name] would like to be removed from any further contact.  [name] [name], Student-SW 03/11/2016, 4:59 PM

## 2016-03-11 NOTE — BHH Suicide Risk Assessment (Signed)
[facility]   Nursing information obtained from:    Demographic factors:    Current Mental Status:    Loss Factors:    Historical Factors:    Risk Reduction Factors:     Total Time spent with patient: 30 minutes Principal Problem: Bipolar disorder, curr episode mixed, severe, with psychotic features (HCC) Diagnosis:   Patient Active Problem List   Diagnosis Date Noted  . Bipolar disorder, curr episode mixed, severe, with psychotic features (HCC) [F31.64] 03/11/2016  . Cannabis use disorder, severe, dependence (HCC) [F12.20] 03/11/2016   Subjective Data: Please see H&P.   Continued Clinical Symptoms:  Alcohol Use Disorder Identification Test Final Score (AUDIT): 0 The "Alcohol Use Disorders Identification Test", Guidelines for Use in Primary Care, Second Edition.  World Science writerHealth Organization [facility](WHO). Score between 0-7:  no or low risk or alcohol related problems. Score between 8-15:  moderate risk of alcohol related problems. Score between 16-19:  high risk of alcohol related problems. Score 20 or above:  warrants further diagnostic evaluation for alcohol dependence and treatment.   CLINICAL FACTORS:   Alcohol/Substance Abuse/Dependencies   Musculoskeletal: Strength & Muscle Tone: within normal limits Gait & Station: normal Patient leans: N/A  Psychiatric Specialty Exam: Review of Systems  Psychiatric/Behavioral: Positive for depression, hallucinations and substance abuse. The patient is nervous/anxious and has insomnia.   All other systems reviewed and are negative.   Blood pressure 127/102, pulse 115, temperature 97.9 F (36.6 C), temperature source Oral, resp. rate 18, SpO2 100 %.There is no height or weight on file to calculate BMI.                          Please see H&P.                               COGNITIVE FEATURES THAT CONTRIBUTE TO RISK:  Closed-mindedness, Polarized thinking and Thought constriction (tunnel  vision)    SUICIDE RISK:   Moderate:  Frequent suicidal ideation with limited intensity, and duration, some specificity in terms of plans, no associated intent, good self-control, limited dysphoria/symptomatology, some risk factors present, and identifiable protective factors, including available and accessible social support.  PLAN OF CARE: Please see H&P.   I certify that inpatient services furnished can reasonably be expected to improve the patient's condition.   Marrian Bells, MD 03/11/2016, 9:50 AM

## 2016-03-12 LAB — HEMOGLOBIN A1C
Hgb A1c MFr Bld: 5.4 % (ref 4.8–5.6)
Mean Plasma Glucose: 108 mg/dL

## 2016-03-12 LAB — PROLACTIN: Prolactin: 23.1 ng/mL (ref 4.8–23.3)

## 2016-03-12 MED ORDER — QUETIAPINE FUMARATE 200 MG PO TABS
200.0000 mg | ORAL_TABLET | Freq: Every day | ORAL | Status: DC
  Administered 2016-03-12 – 2016-03-15 (×4): 200 mg via ORAL
  Filled 2016-03-12 (×7): qty 1

## 2016-03-12 MED ORDER — HYDROXYZINE HCL 50 MG PO TABS
50.0000 mg | ORAL_TABLET | Freq: Once | ORAL | Status: AC
  Administered 2016-03-12: 50 mg via ORAL
  Filled 2016-03-12 (×2): qty 1

## 2016-03-12 NOTE — Progress Notes (Signed)
[facility] MD Progress Note  03/12/2016 2:08 PM [name]  MRN:  [id number] Subjective:  Pt states " I am anxious , I hear the Lord all the time.'  Objective;[name] is a 35 y.o. AA female , who is married , employed , lives with her husband in [place] , denies past hx of mental illness, who presented voluntarily to [facility] as a walk in, accompanied by her parents, Ian Bushman & Neale Burly for aggressive, bizarre behavior at home.  Patient seen and chart reviewed.Discussed patient with treatment team.  Pt today continues to be psychotic - is seen as delusional, labile , restless and loud on the unit. Pt per staff has been delusional about being married to a person called woody and gave a phone number to contact him. However , when CSW contacted him - he reported he was just a colleague and not her husband. Pt when asked about this became very anxious and stopped talking to Probation officer. She later on told writer she is married in "spirit ' to him. Pt continues to need a lot of encouragement and support.    Principal Problem: Bipolar disorder, curr episode mixed, severe, with psychotic features ([place]) Diagnosis:   Patient Active Problem List   Diagnosis Date Noted  . Bipolar disorder, curr episode mixed, severe, with psychotic features ([place]) [F31.64] 03/11/2016  . Cannabis use disorder, severe, dependence ([place]) [F12.20] 03/11/2016   Total Time spent with patient: 30 minutes  Past Psychiatric History: Pt denies past hx of mental illness, but reports suicide attempt years ago , by cutting her wrist , broke down in to tears when she talked about that, did not elaborate further  Past Medical History: denies hx of htn,dm,thyroid do. Past Surgical History  Procedure Laterality Date  . Cesarean section     Family History:  Family History  Problem Relation Age of Onset  . Mental illness Neg Hx    Family Psychiatric  History: Pt denies hx of mental illness in family. 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 Yes  . Special: Marijuana    Social History   Social History  . Marital Status: Single    Spouse Name: N/A  . Number of Children: N/A  . Years of Education: N/A   Social History Main Topics  . Smoking status: Never Smoker   . Smokeless tobacco: None  . Alcohol Use: None  . Drug Use: Yes    Special: Marijuana  . Sexual Activity: Not Asked   Other Topics Concern  . None   Social History Narrative   Additional Social History:    Pain Medications: none reported Prescriptions: none reported Over the Counter: none reported History of alcohol / drug use?:  (parents report hx of use, but no abuse; not sure if currently using)                    Sleep: Poor  Appetite:  Fair  Current Medications: Current Facility-Administered Medications  Medication Dose Route Frequency Provider Last Rate Last Dose  . acetaminophen (TYLENOL) tablet 650 mg  650 mg Oral Q6H PRN Derrill Center, NP      . alum & mag hydroxide-simeth (MAALOX/MYLANTA) 200-200-20 MG/5ML suspension 30 mL  30 mL Oral Q4H PRN Derrill Center, NP      .  lithium carbonate capsule 300 mg  300 mg Oral BID WC Ursula Alert, MD   300 mg at 03/12/16 0817  . LORazepam (ATIVAN) tablet 1 mg  1 mg Oral Q6H PRN Ursula Alert, MD   1 mg at 03/12/16 0817   Or  . LORazepam (ATIVAN) injection 1 mg  1 mg Intramuscular Q6H PRN Tuck Dulworth, MD      . magnesium hydroxide (MILK OF MAGNESIA) suspension 30 mL  30 mL Oral Daily PRN Derrill Center, NP      . OLANZapine zydis (ZYPREXA) disintegrating tablet 10 mg  10 mg Oral Q8H PRN Ursula Alert, MD   10 mg at 03/12/16 0558   Or  . OLANZapine (ZYPREXA) injection 10 mg  10 mg Intramuscular Q8H PRN Neithan Day, MD      . potassium chloride SA (K-DUR,KLOR-CON) CR tablet 20 mEq  20 mEq Oral BID Ursula Alert, MD   20 mEq at 03/12/16  0817  . QUEtiapine (SEROQUEL) tablet 200 mg  200 mg Oral QHS Cherylanne Ardelean, MD      . traZODone (DESYREL) tablet 50 mg  50 mg Oral QHS PRN Ursula Alert, MD   50 mg at 03/12/16 0106    Lab Results:  Results for orders placed or performed during the hospital encounter of 03/10/16 (from the past 48 hour(s))  CBC     Status: None   Collection Time: 03/10/16  6:20 PM  Result Value Ref Range   WBC 7.8 4.0 - 10.5 K/uL   RBC 4.44 3.87 - 5.11 MIL/uL   Hemoglobin 13.0 12.0 - 15.0 g/dL   HCT 39.9 36.0 - 46.0 %   MCV 89.9 78.0 - 100.0 fL   MCH 29.3 26.0 - 34.0 pg   MCHC 32.6 30.0 - 36.0 g/dL   RDW 15.1 11.5 - 15.5 %   Platelets 381 150 - 400 K/uL    Comment: Performed at [facility]  Comprehensive metabolic panel     Status: Abnormal   Collection Time: 03/10/16  6:20 PM  Result Value Ref Range   Sodium 141 135 - 145 mmol/L   Potassium 3.0 (L) 3.5 - 5.1 mmol/L   Chloride 105 101 - 111 mmol/L   CO2 26 22 - 32 mmol/L   Glucose, Bld 99 65 - 99 mg/dL   BUN 12 6 - 20 mg/dL   Creatinine, Ser 0.98 0.44 - 1.00 mg/dL   Calcium 9.0 8.9 - 10.3 mg/dL   Total Protein 7.4 6.5 - 8.1 g/dL   Albumin 3.9 3.5 - 5.0 g/dL   AST 19 15 - 41 U/L   ALT 16 14 - 54 U/L   Alkaline Phosphatase 57 38 - 126 U/L   Total Bilirubin 0.7 0.3 - 1.2 mg/dL   GFR calc non Af Amer >60 >60 mL/min   GFR calc Af Amer >60 >60 mL/min    Comment: (NOTE) The eGFR has been calculated using the CKD EPI equation. This calculation has not been validated in all clinical situations. eGFR's persistently <60 mL/min signify possible Chronic Kidney Disease.    Anion gap 10 5 - 15    Comment: Performed at [facility]  Hemoglobin A1c     Status: None   Collection Time: 03/10/16  6:20 PM  Result Value Ref Range   Hgb A1c MFr Bld 5.4 4.8 - 5.6 %    Comment: (NOTE)         Pre-diabetes: 5.7 - 6.4  Diabetes: >6.4         Glycemic control for adults with diabetes: <7.0    Mean Plasma  Glucose 108 mg/dL    Comment: (NOTE) Performed At: [facility] [place], [place] [id number] Lindon Romp MD [id number] Performed at [facility]   Ethanol     Status: None   Collection Time: 03/10/16  6:20 PM  Result Value Ref Range   Alcohol, Ethyl (B) <5 <5 mg/dL    Comment:        LOWEST DETECTABLE LIMIT FOR SERUM ALCOHOL IS 5 mg/dL FOR MEDICAL PURPOSES ONLY Performed at [facility]   TSH     Status: None   Collection Time: 03/10/16  6:20 PM  Result Value Ref Range   TSH 0.447 0.350 - 4.500 uIU/mL    Comment: Performed at [facility]  Prolactin     Status: None   Collection Time: 03/10/16  6:20 PM  Result Value Ref Range   Prolactin 23.1 4.8 - 23.3 ng/mL    Comment: (NOTE) Performed At: [facility] [place], [place] [id number] Lindon Romp MD [id number] Performed at [facility]   Lipid panel, fasting     Status: Abnormal   Collection Time: 03/11/16  6:52 AM  Result Value Ref Range   Cholesterol 137 0 - 200 mg/dL   Triglycerides 146 <150 mg/dL   HDL 30 (L) >40 mg/dL   Total CHOL/HDL Ratio 4.6 RATIO   VLDL 29 0 - 40 mg/dL   LDL Cholesterol 78 0 - 99 mg/dL    Comment:        Total Cholesterol/HDL:CHD Risk Coronary Heart Disease Risk Table                     Men   Women  1/2 Average Risk   3.4   3.3  Average Risk       5.0   4.4  2 X Average Risk   9.6   7.1  3 X Average Risk  23.4   11.0        Use the calculated Patient Ratio above and the CHD Risk Table to determine the patient's CHD Risk.        ATP III CLASSIFICATION (LDL):  <100     mg/dL   Optimal  100-129  mg/dL   Near or Above                    Optimal  130-159  mg/dL   Borderline  160-189  mg/dL   High  >190     mg/dL   Very High Performed at [facility]   Urine rapid drug screen (hosp performed)not at [facility]     Status: Abnormal   Collection Time:  03/11/16 10:18 AM  Result Value Ref Range   Opiates NONE DETECTED NONE DETECTED   Cocaine NONE DETECTED NONE DETECTED   Benzodiazepines POSITIVE (A) NONE DETECTED   Amphetamines NONE DETECTED NONE DETECTED   Tetrahydrocannabinol POSITIVE (A) NONE DETECTED   Barbiturates NONE DETECTED NONE DETECTED    Comment:        DRUG SCREEN FOR MEDICAL PURPOSES ONLY.  IF CONFIRMATION IS NEEDED FOR ANY PURPOSE, NOTIFY LAB WITHIN 5 DAYS.        LOWEST DETECTABLE LIMITS FOR URINE DRUG SCREEN Drug Class       Cutoff (ng/mL) Amphetamine  1000 Barbiturate      200 Benzodiazepine   832 Tricyclics       919 Opiates          300 Cocaine          300 THC              50 Performed at [facility]   Pregnancy, urine     Status: None   Collection Time: 03/11/16 10:18 AM  Result Value Ref Range   Preg Test, Ur NEGATIVE NEGATIVE    Comment:        THE SENSITIVITY OF THIS METHODOLOGY IS >20 mIU/mL. Performed at [facility]     Blood Alcohol level:  Lab Results  Component Value Date   [facility] <5 03/10/2016   ETH <5 02/17/2016    Physical Findings: AIMS: Facial and Oral Movements Muscles of Facial Expression: None, normal Lips and Perioral Area: None, normal Jaw: None, normal Tongue: None, normal,Extremity Movements Upper (arms, wrists, hands, fingers): None, normal Lower (legs, knees, ankles, toes): None, normal, Trunk Movements Neck, shoulders, hips: None, normal, Overall Severity Severity of abnormal movements (highest score from questions above): None, normal Incapacitation due to abnormal movements: None, normal Patient's awareness of abnormal movements (rate only patient's report): No Awareness, Dental Status Current problems with teeth and/or dentures?: No Does patient usually wear dentures?: No  CIWA:    COWS:     Musculoskeletal: Strength & Muscle Tone: within normal limits Gait & Station: normal Patient leans: N/A  Psychiatric  Specialty Exam: Review of Systems  Psychiatric/Behavioral: Positive for hallucinations and substance abuse. The patient is nervous/anxious and has insomnia.   All other systems reviewed and are negative.   Blood pressure 114/73, pulse 100, temperature 98.7 F (37.1 C), temperature source Oral, resp. rate 20, SpO2 100 %.There is no height or weight on file to calculate BMI.  General Appearance: Disheveled  Eye Contact::  Minimal  Speech:  Pressured  Volume:  Increased  Mood:  Angry and Anxious  Affect:  Labile and Tearful  Thought Process:  Disorganized and Irrelevant  Orientation:  Full (Time, Place, and Person)  Thought Content:  Delusions, Hallucinations: Auditory and Paranoid Ideation  Suicidal Thoughts:  No  Homicidal Thoughts:  No  Memory:  Immediate;   Fair Recent;   Fair Remote;   Fair  Judgement:  Impaired  Insight:  Lacking  Psychomotor Activity:  Increased  Concentration:  Poor  Recall:  [employer] of Knowledge:Fair  Language: Fair  Akathisia:  No  Handed:  Right  AIMS (if indicated):     Assets:  Desire for Improvement  ADL's:  Intact  Cognition: WNL  Sleep:  Number of Hours: 3.25   Treatment Plan Summary:[name] is a 35 y.o. AA female , who is married , employed , lives with her husband in [place] , denies past hx of mental illness, who presented voluntarily to [facility] as a walk in, accompanied by her parents, Ian Bushman & Neale Burly for aggressive, bizarre behavior at home.Pt continues to be hyperactive , labile , tearful and disorganized. Pt will continue to need treatment.   Will increase Seroquel to 200 mg po qhs for psychosis/mood sx.Serqouel can be titrated higher the next few days to augment the effect of Lithium. Will continue Li 300 mg po bid for mood lability. Li level in 5 days since it can be toxic at higher levels. Will continue Trazodone 50 mg po qhs prn for sleep.  Will make available PRN medications as per agitation protocol. Will continue to  monitor vitals ,medication compliance and treatment side effects while patient is here.  Will monitor for medical issues as well as call consult as needed.  Ordered CBC- wnl , CMP- K low , started KDUR replacement , repeat BMP on 03/14/16. TSH- wnl , Lipid panel- wnl ,pending hba1c,pending  PL , UDS- pos for THC ,Bzd ( likely received it in ED) , BAl<5 ,Urine preg test- negative  and EKG for qtc- wnl. CSW will continue working on disposition.  Patient to participate in therapeutic milieu .      Daily contact with patient to assess and evaluate symptoms and progress in treatment and Medication management  Kaemon Barnett, MD 03/12/2016, 2:08 PM

## 2016-03-12 NOTE — BHH Suicide Risk Assessment (Addendum)
BHH INPATIENT:  Family/Significant Other Suicide Prevention Education  Suicide Prevention Education:  Education Completed; No one has been identified by the patient as the family member/significant other with whom the patient will be residing, and identified as the person(s) who will aid the patient in the event of a mental health crisis (suicidal ideations/suicide attempt).  With written consent from the patient, the family member/significant other has been provided the following suicide prevention education, prior to the and/or following the discharge of the patient.  The suicide prevention education provided includes the following:  Suicide risk factors  Suicide prevention and interventions  National Suicide Hotline telephone number  [facility] assessment telephone number  [facility] Emergency Assistance 911  [facility] and/or Residential Mobile Crisis Unit telephone number  Request made of family/significant other to:  Remove weapons (e.g., guns, rifles, knives), all items previously/currently identified as safety concern.    Remove drugs/medications (over-the-counter, prescriptions, illicit drugs), all items previously/currently identified as a safety concern.  The family member/significant other verbalizes understanding of the suicide prevention education information provided.  The family member/significant other agrees to remove the items of safety concern listed above. The patient did not endorse SI at the time of admission, nor did the patient c/o SI during the stay here.  SPE not required. However, I did talk to husband Gwinda Maineerry Gorden, [phone], and we went over a crises plan. Daryel Geraldorth, Cookie Pore B 03/12/2016, 10:32 AM

## 2016-03-12 NOTE — BHH Group Notes (Signed)
BHH Group Notes:  (Nursing/MHT/Case Management/Adjunct)  Date:  03/12/2016  Time:  3:08 PM  Type of Therapy:  Nurse Education  Participation Level:  Did Not Attend   Mickie Baillizabeth O Iwenekha 03/12/2016, 3:08 PM

## 2016-03-12 NOTE — Progress Notes (Signed)
  Patient lack capacity to participate in disposition planning.   [name] ,MD [facility]

## 2016-03-12 NOTE — Progress Notes (Signed)
Adult Psychoeducational Group Note  Date:  03/12/2016 Time:  8:10 PM  Group Topic/Focus:  Wrap-Up Group:   The focus of this group is to help patients review their daily goal of treatment and discuss progress on daily workbooks.  Participation Level:  Minimal  Participation Quality:  Attentive  Affect:  Anxious  Cognitive:  Disorganized  Insight: Lacking  Engagement in Group:  Limited  Modes of Intervention:  Discussion  Additional Comments:  Pt seemed anxious during group. When asked how her day was, pt responded loudly that she felt like she was going to throw up. After multiple times of asking pt if she felt nauseous, she finally responded "good", as in answering the question that she was first asked.   Natalie NeerJasmine Becker [name] 03/12/2016, 8:25 PM

## 2016-03-12 NOTE — BHH Group Notes (Signed)
BHH Group Notes:  (Counselor/Nursing/MHT/Case Management/Adjunct)  03/12/2016 1:15PM  Type of Therapy:  Group Therapy  Participation Level:  Active  Participation Quality:  Appropriate  Affect:  Flat  Cognitive:  Oriented  Insight:  Improving  Engagement in Group:  Limited  Engagement in Therapy:  Limited  Modes of Intervention:  Discussion, Exploration and Socialization  Summary of Progress/Problems: The topic for group was balance in life.  Pt participated in the discussion about when their life was in balance and out of balance and how this feels.  Pt discussed ways to get back in balance and short term goals they can work on to get where they want to be. When gathering people for group, found Lawson FiscalLori in the room of a female peer.  She came out immediately, and I alerted other staff.  She came to group briefly, left, eventually returned.  Sat quietly.  "I am a child of God.  I want to tell you all about my 8th grade graduation."  Went on to tell us about that night-and how she felt special. "i was never able to finish school.  But I did get a job."   Daryel Geraldorth, Verity Gilcrest B 03/12/2016 1:03 PM

## 2016-03-12 NOTE — Progress Notes (Signed)
D: Pt continues to be paranoid, suspicious of others, hyperactive and tearful at intervals during this shift; refusing to go to her room at times with fear "something is going to happen to me". Pt is tangential with flight of ideas, speech is loud and pressured. Required multiple verbal redirections throughout this shift related to verbal outbursta and going into peers' rooms. Denies SI, HI, AVH and pain; however, pt appears to be responding to internal stimuli. Observed talking loudly to self in her room, mumbling while pacing in hallway and continues to be hyper-religious as well. Pt reported dizziness related to medication side effects. A: Scheduled medications and PRN medications [facility]. Verbal education done on safety related to dizziness and fall prevention. Emotional support, availability and encouragement provided to pt throughout this shift. Q 15 minutes checks maintained.  R: No evidence of learning related to safety education due to pt's disorganized state and continued hyperactivity. Continue POC. Safety maintained on and off unit.

## 2016-03-13 MED ORDER — TRAZODONE HCL 100 MG PO TABS
100.0000 mg | ORAL_TABLET | Freq: Every day | ORAL | Status: DC
  Administered 2016-03-13 – 2016-03-15 (×3): 100 mg via ORAL
  Filled 2016-03-13 (×6): qty 1

## 2016-03-13 NOTE — Progress Notes (Signed)
Patient ID: Natalie MealingLori M Becker, female   DOB: [birth year], 35 y.o.   MRN: [id number] D: Client visible on the unit, has visit with mom today. "I love my momma" client appears a little subdued today, preoccupied. 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 A; Writer encouraged client to take medications, as she is hesitant "It ain't going to kill me is it" "am I dying" Staff will monitor q6315min for safety. R: Client is safe on the unit.

## 2016-03-13 NOTE — Progress Notes (Signed)
Adult Psychoeducational Group Note  Date:  03/13/2016 Time:  9:28 PM  Group Topic/Focus:  Wrap-Up Group:   The focus of this group is to help patients review their daily goal of treatment and discuss progress on daily workbooks.  Participation Level:  Active  Participation Quality:  Sharing  Affect:  Labile  Cognitive:  Lacking  Insight: Limited  Engagement in Group:  Engaged  Modes of Intervention:  Socialization and Support  Additional Comments:  Patient attended and participated in group tonight. She reports that she read today, her mother visited and the visit went well. She advised that the staff here has been encouraging. She went off topic onto a totally different conversation. She had to be redirected back on topic twice.  She advised that today was just different  [name], [name] [place] 03/13/2016, 9:28 PM

## 2016-03-13 NOTE — Progress Notes (Signed)
Patient ID: Natalie MealingLori M Whisner, female   DOB: Oct 22, 1981, 35 y.o.   MR[facility]: [id number] [facility]  03/13/2016 3:05 PM Natalie MealingLori M Siciliano  MRN:  [id number]  Subjective:  Lawson FiscalLori reports, "I;m not getting any sleep at night. I'm restless at night"  Objective;Natalie MealingLori M Deleeuw is a [age] y.o. AA female, who is married, employed, [place], denies past hx of mental illness, [facility], accompanied by her parents, Charlott RakesLee McKinnon & Mercie EonSharon Anderson for aggressive, bizarre behavior at home.  Patient seen and chart reviewed.Discussed patient with treatment team. Patient today continues to be psychotic - is seen as delusional, labile, restless and loud on the unit. Patient per staff has been delusional about being married to a person called woody and gave a phone number to contact him. However, when CSW contacted him - he reported he was just a colleague and not her husband. Patient when asked about this became very anxious and stopped talking to Clinical research associatewriter. She later on told writer she is married in "spirit ' to him. She says today she is smiling because she is feeling tingly inside of her just because she is in love. She adds, that is what love does to you. Lawson FiscalLori continues to need a lot of encouragement and support. Will increase Trazodone to 100 mg to aid her sleep.  Principal Problem: Bipolar disorder, curr episode mixed, severe, with psychotic features (HCC) Diagnosis:   Patient Active Problem List   Diagnosis Date Noted  . Bipolar disorder, curr episode mixed, severe, with psychotic features (HCC) [F31.64] 03/11/2016  . Cannabis use disorder, severe, dependence (HCC) [F12.20] 03/11/2016   Total Time spent with patient: 15 minutes  Past Psychiatric History: Pt denies past hx of mental illness, but reports suicide attempt years ago , by cutting her wrist , broke down in to tears when she talked about that, did not elaborate further  Past Medical History: denies hx of  htn,dm,thyroid do. Past Surgical History  Procedure Laterality Date  . Cesarean section     Family History:  Family History  Problem Relation Age of Onset  . Mental illness Neg Hx    Family Psychiatric  History: Pt denies hx of mental illness in family. 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 Yes  . Special: Marijuana    Social History   Social History  . Marital Status: Single    Spouse Name: N/A  . Number of Children: N/A  . Years of Education: N/A   Social History Main Topics  . Smoking status: Never Smoker   . Smokeless tobacco: None  . Alcohol Use: None  . Drug Use: Yes    Special: Marijuana  . Sexual Activity: Not Asked   Other Topics Concern  . None   Social History Narrative   Additional Social History:    Pain Medications: none reported Prescriptions: none reported Over the Counter: none reported History of alcohol / drug use?:  (parents report hx of use, but no abuse; not sure if currently using)  Sleep: Poor  Appetite:  Fair  Current Medications: Current Facility-Administered Medications  Medication Dose Route Frequency Provider Last Rate Last Dose  . acetaminophen (TYLENOL) tablet 650 mg  650 mg Oral Q6H PRN Oneta Rackanika N Lewis,  NP      . alum & mag hydroxide-simeth (MAALOX/MYLANTA) 200-200-20 MG/5ML suspension 30 mL  30 mL Oral Q4H PRN Oneta Rack, NP      . lithium carbonate capsule 300 mg  300 mg Oral BID WC [name], MD   300 mg at 03/13/16 0824  . LORazepam (ATIVAN) tablet 1 mg  1 mg Oral Q6H PRN [name], MD   1 mg at 03/13/16 0015   Or  . LORazepam (ATIVAN) injection 1 mg  1 mg Intramuscular Q6H PRN Saramma Eappen, MD      . magnesium hydroxide (MILK OF MAGNESIA) suspension 30 mL  30 mL Oral Daily PRN Oneta Rack, NP      . OLANZapine zydis (ZYPREXA) disintegrating tablet 10 mg  10 mg Oral  Q8H PRN [name], MD   10 mg at 03/12/16 0558   Or  . OLANZapine (ZYPREXA) injection 10 mg  10 mg Intramuscular Q8H PRN Saramma Eappen, MD      . QUEtiapine (SEROQUEL) tablet 200 mg  200 mg Oral QHS Saramma Eappen, MD   200 mg at 03/12/16 2257  . traZODone (DESYREL) tablet 50 mg  50 mg Oral QHS PRN [name], MD   50 mg at 03/12/16 0106   Lab Results:  No results found for this or any previous visit (from the past 48 hour(s)).  Blood Alcohol level:  Lab Results  Component Value Date   ETH <5 03/10/2016   ETH <5 02/17/2016   Physical Findings: AIMS: Facial and Oral Movements Muscles of Facial Expression: None, normal Lips and Perioral Area: None, normal Jaw: None, normal Tongue: None, normal,Extremity Movements Upper (arms, wrists, hands, fingers): None, normal Lower (legs, knees, ankles, toes): None, normal, Trunk Movements Neck, shoulders, hips: None, normal, Overall Severity Severity of abnormal movements (highest score from questions above): None, normal Incapacitation due to abnormal movements: None, normal Patient's awareness of abnormal movements (rate only patient's report): No Awareness, Dental Status Current problems with teeth and/or dentures?: No Does patient usually wear dentures?: No  CIWA:    COWS:     Musculoskeletal: Strength & Muscle Tone: within normal limits Gait & Station: normal Patient leans: N/A  Psychiatric Specialty Exam: Review of Systems  Psychiatric/Behavioral: Positive for hallucinations and substance abuse. The patient is nervous/anxious and has insomnia.   All other systems reviewed and are negative.   Blood pressure 126/89, pulse 101, temperature 97.5 F (36.4 C), temperature source Oral, resp. rate 20, SpO2 100 %.There is no height or weight on file to calculate BMI.  General Appearance: Disheveled  Eye Contact::  Minimal  Speech:  Pressured  Volume:  Increased  Mood:  Angry and Anxious  Affect:  Labile and Tearful  Thought  Process:  Disorganized and Irrelevant  Orientation:  Full (Time, Place, and Person)  Thought Content:  Delusions, Hallucinations: Auditory and Paranoid Ideation  Suicidal Thoughts:  No  Homicidal Thoughts:  No  Memory:  Immediate;   Fair Recent;   Fair Remote;   Fair  Judgement:  Impaired  Insight:  Lacking  Psychomotor Activity:  Increased  Concentration:  Poor  Recall:  [employer] of Knowledge:Fair  Language: Fair  Akathisia:  No  Handed:  Right  AIMS (if indicated):     Assets:  Desire for Improvement  ADL's:  Intact  Cognition: WNL  Sleep:  Number of Hours: 2.25   Treatment Plan Summary:Pricsilla Judie Petit Bozarth is a 35 y.o. AA female , who is married ,  employed , lives with her husband in [place] , denies past hx of mental illness, who presented voluntarily to [facility] as a walk in, accompanied by her parents, Charlott Rakes & Mercie Eon for aggressive, bizarre behavior at home.Pt continues to be hyperactive , labile , tearful and disorganized. Pt will continue to need treatment.  Treatment plan/summar: Daily contact with patient to assess and evaluate symptoms and progress in treatment and Medication management  Will continue Seroquel 200 mg po qhs for psychosis/mood sx, Serqouel can be titrated higher the next few days to augment the effect of Lithium. Will continue Lithium 300 mg po bid for mood lability. Lithium level in 5 days since it can be toxic at higher levels. Will increase Trazodone to 100 mg po Q hs routinely for sleep. Will make available PRN medications as per agitation protocol. Will continue to monitor vitals ,medication compliance and treatment side effects while patient is here.  Will monitor for medical issues as well as call consult as needed.  Ordered CBC- wnl , CMP- K low , started KDUR replacement , repeat BMP on 03/14/16. TSH- wnl , Lipid panel- wnl ,pending hba1c,pending  PL , UDS- pos for THC ,Bzd ( likely received it in ED), BAl<5 ,Urine preg test- negative  and EKG for  qtc- wnl. CSW will continue working on disposition.  Patient to participate in therapeutic milieu.  Continue current plan of care.  Sanjuana Kava, NP, PMHNP, FNP-BC 03/13/2016, 3:05 PM

## 2016-03-13 NOTE — [facility] Group Notes (Signed)
[facility]   03/13/2016 11:34 AM  Participation Quality: Active  Mood/Affect:  Anxious  Depression Rating:  Denies   Anxiety Rating: High  Thoughts of Suicide:  No Will you contract for safety?   NA  Current AVH:  Yes  Plan for Discharge/Comments:  Pt states that she has not been feeling like herself. "I've been having so many crazy thoughts but I don't want to tell anyone because I don't want to hurt anyone's feelings." Pt endorse AVH. Pt will follow up with [facility].  Transportation Means:   Supports:  [name] B Bryant

## 2016-03-13 NOTE — Progress Notes (Signed)
D: Client visits with mom and dad, reports nice visit. Parents reports concerns that daughter is talking about seeing death and having dry mouth and dizziness. Client also seen in dayroom calm talking to female peer ane watching TV. Client fearful at times and whining "but I'm scared" "am I going to die?" A" Writer encouraged client to think of good thoughts. Provided support assuring client she was safe on the unit and that staff will be on the hall all night check on her and other clients. R: Client is safe on the unit, attended group.

## 2016-03-13 NOTE — Progress Notes (Addendum)
D Lawson FiscalLori is OOB UAL on the 500 hall today...she tolerates this fairly well. Her behavior demonstates mania as evidenced by her poor boundaries ( she has no filter when she speaks and says whatever pops into her mind), she will burst out into singing her favorite gospel hymnal and then start cussing . She completes her daily assessment and on it she wrote she deneid SI today and she rated her depression, hopelessness and anxiety " 3/0/10", respectively. A She takes her medications. R Safety in place and support offered. She was given a prn dose of ativan 1.0 mg po and zyprexa po at 1556 today, for being agitated and loud. She responded positively, although she stated she was sleepy. She demonstrates tangential thinking and is religiously preoccupied and remains guarded and paranoid.

## 2016-03-13 NOTE — [facility] Group Notes (Signed)
[facility] LCSW Group Therapy   03/13/2016 1:30 PM  Type of Therapy: Group Therapy  Participation Level:  Active  Participation Quality:  Attentive  Affect:  Flat  Cognitive:  Oriented  Insight:  Limited  Engagement in Therapy:  Engaged  Modes of Intervention:  Discussion and Socialization  Summary of Progress/Problems: Chaplain was here to lead a group on themes of hope and/or courage.  "Courage is Special educational needs teachermighty. I say mighty because it reminds me of God and God is what gives me courage." Pt went on to explain that she hasn't finished her education and this bothers her.  [name] B Bryant 03/13/2016 1:30 PM

## 2016-03-14 LAB — BASIC METABOLIC PANEL
ANION GAP: 10 (ref 5–15)
BUN: 7 mg/dL (ref 6–20)
CALCIUM: 9.3 mg/dL (ref 8.9–10.3)
CO2: 26 mmol/L (ref 22–32)
Chloride: 105 mmol/L (ref 101–111)
Creatinine, Ser: 0.73 mg/dL (ref 0.44–1.00)
Glucose, Bld: 104 mg/dL — ABNORMAL HIGH (ref 65–99)
POTASSIUM: 3.7 mmol/L (ref 3.5–5.1)
Sodium: 141 mmol/L (ref 135–145)

## 2016-03-14 NOTE — Progress Notes (Signed)
D- Patient appears anxious and paranoid this shift.  Patient denies SI/HI/ was reported that during breakfast time, patient had to be escorted back on the unit due to agitation and paranoia.  Patient became hyper religious and stated that things in the cafeteria were "dirty" and if she were to eat anything she would be connected forever to the person who gave it to her.  Once on the unit, patient refused to go on her hall and had to be verbally deescalated in order to walk on the hall.  Paranoia continued at lunch when patient refused to eat and drink items brought back on the unit for her from fear of being connected to staff member and cheating on her husband.  No other issues this shift.  On patient's self-inventory sheet she rated her depression "1", feelings of hopelessness "0", and anxiety "10" with 10 being the worst.   A- Scheduled medications administered to patient, per MD orders. Support and encouragement provided.  Routine safety checks conducted every 15 minutes.  Patient informed to notify staff with problems or concerns. R- No adverse drug reactions noted. Patient contracts for safety at this time. Patient remains safe at this time.

## 2016-03-14 NOTE — Progress Notes (Signed)
Pt did not attend wrap-up group   

## 2016-03-14 NOTE — Progress Notes (Signed)
Patient ID: MELEK POWNALL, female   DOB: February 14, 1981, 35 y.o.   MRN: [id number] [facility] MD Progress Note  03/14/2016 12:12 PM JORDYAN HARDIMAN  MRN:  [id number]  Subjective:  Brithany reports, not slept much . Remains guarded  Objective;ARIEL DIMITRI is a 35 y.o. AA female, who is married, employed, lives with her husband in [place], denies past hx of mental illness, who presented voluntarily to [facility] as a walk in, accompanied by her parents, Ian Bushman & Neale Burly for aggressive, bizarre behavior at home.  Patient seen and chart reviewed.Discussed patient with treatment team. Patient today continues to be guarded and was reported to be hyper religious and resistent to come back from cafeteria.   Staff reported that they had to give her zydis and ativan for getting loud and unpredictable. She did get calmer after couple of hours and was cooperative during this interview.  Mood : remains unpredictable at times guarded  Principal Problem: Bipolar disorder, curr episode mixed, severe, with psychotic features ([place]) Diagnosis:   Patient Active Problem List   Diagnosis Date Noted  . Bipolar disorder, curr episode mixed, severe, with psychotic features ([place]) [F31.64] 03/11/2016  . Cannabis use disorder, severe, dependence ([place]) [F12.20] 03/11/2016   Total Time spent with patient: 25 minutes   Past Medical History: denies hx of htn,dm,thyroid do. Past Surgical History  Procedure Laterality Date  . Cesarean section     Family History:  Family History  Problem Relation Age of Onset  . Mental illness Neg Hx    Family Psychiatric  History: Pt denies hx of mental illness in family. 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 Yes  . Special: Marijuana    Social History   Social History  . Marital Status: Single    Spouse Name: N/A  . Number of Children: N/A  . Years  of Education: N/A   Social History Main Topics  . Smoking status: Never Smoker   . Smokeless tobacco: None  . Alcohol Use: None  . Drug Use: Yes    Special: Marijuana  . Sexual Activity: Not Asked   Other Topics Concern  . None   Social History Narrative   Additional Social History:    Pain Medications: none reported Prescriptions: none reported Over the Counter: none reported History of alcohol / drug use?:  (parents report hx of use, but no abuse; not sure if currently using)  Sleep: Poor  Appetite:  Fair  Current Medications: Current Facility-Administered Medications  Medication Dose Route Frequency Provider Last Rate Last Dose  . acetaminophen (TYLENOL) tablet 650 mg  650 mg Oral Q6H PRN Derrill Center, NP      . alum & mag hydroxide-simeth (MAALOX/MYLANTA) 200-200-20 MG/5ML suspension 30 mL  30 mL Oral Q4H PRN Derrill Center, NP      . lithium carbonate capsule 300 mg  300 mg Oral BID WC Ursula Alert, MD   300 mg at 03/14/16 0805  . LORazepam (ATIVAN) tablet 1 mg  1 mg Oral Q6H PRN Ursula Alert, MD   1 mg at 03/14/16 0768   Or  . LORazepam (ATIVAN) injection 1 mg  1 mg Intramuscular Q6H PRN Saramma Eappen, MD      . magnesium hydroxide (MILK OF MAGNESIA) suspension 30 mL  30 mL  Oral Daily PRN Derrill Center, NP      . OLANZapine zydis (ZYPREXA) disintegrating tablet 10 mg  10 mg Oral Q8H PRN Ursula Alert, MD   10 mg at 03/14/16 2979   Or  . OLANZapine (ZYPREXA) injection 10 mg  10 mg Intramuscular Q8H PRN Saramma Eappen, MD      . QUEtiapine (SEROQUEL) tablet 200 mg  200 mg Oral QHS Ursula Alert, MD   200 mg at 03/13/16 2159  . traZODone (DESYREL) tablet 100 mg  100 mg Oral QHS Encarnacion Slates, NP   100 mg at 03/13/16 2159   Lab Results:  Results for orders placed or performed during the hospital encounter of 03/10/16 (from the past 48 hour(s))  Basic metabolic panel     Status: Abnormal   Collection Time: 03/14/16  6:29 AM  Result Value Ref Range   Sodium  141 135 - 145 mmol/L   Potassium 3.7 3.5 - 5.1 mmol/L   Chloride 105 101 - 111 mmol/L   CO2 26 22 - 32 mmol/L   Glucose, Bld 104 (H) 65 - 99 mg/dL   BUN 7 6 - 20 mg/dL   Creatinine, Ser 0.73 0.44 - 1.00 mg/dL   Calcium 9.3 8.9 - 10.3 mg/dL   GFR calc non Af Amer >60 >60 mL/min   GFR calc Af Amer >60 >60 mL/min    Comment: (NOTE) The eGFR has been calculated using the CKD EPI equation. This calculation has not been validated in all clinical situations. eGFR's persistently <60 mL/min signify possible Chronic Kidney Disease.    Anion gap 10 5 - 15    Comment: Performed at [facility]    Blood Alcohol level:  Lab Results  Component Value Date   [facility] <5 03/10/2016   ETH <5 02/17/2016   Physical Findings: AIMS: Facial and Oral Movements Muscles of Facial Expression: None, normal Lips and Perioral Area: None, normal Jaw: None, normal Tongue: None, normal,Extremity Movements Upper (arms, wrists, hands, fingers): None, normal Lower (legs, knees, ankles, toes): None, normal, Trunk Movements Neck, shoulders, hips: None, normal, Overall Severity Severity of abnormal movements (highest score from questions above): None, normal Incapacitation due to abnormal movements: None, normal Patient's awareness of abnormal movements (rate only patient's report): No Awareness, Dental Status Current problems with teeth and/or dentures?: No Does patient usually wear dentures?: No  CIWA:    COWS:     Musculoskeletal: Strength & Muscle Tone: within normal limits Gait & Station: normal Patient leans: N/A  Psychiatric Specialty Exam: Review of Systems  Constitutional: Negative for fever.  Skin: Negative for rash.  Psychiatric/Behavioral: Positive for hallucinations and substance abuse. The patient is nervous/anxious and has insomnia.   All other systems reviewed and are negative.   Blood pressure 132/102, pulse 115, temperature 98.6 F (37 C), temperature source Oral, resp.  rate 16, SpO2 100 %.There is no height or weight on file to calculate BMI.  General Appearance: Disheveled  Eye Contact::  Minimal  Speech:  Pressured  Volume:  Increased  Mood:  Angry and Anxious  Affect:  labile  Thought Process:  Disorganized and Irrelevant  Orientation:  Full (Time, Place, and Person)  Thought Content:  Paranoid delusional  Suicidal Thoughts:  No  Homicidal Thoughts:  No  Memory:  Immediate;   Fair Recent;   Fair Remote;   Fair  Judgement:  Impaired  Insight:  Lacking  Psychomotor Activity:  Increased  Concentration:  Poor  Recall:  Fair  Fund of Knowledge:Fair  Language: Fair  Akathisia:  No  Handed:  Right  AIMS (if indicated):     Assets:  Desire for Improvement  ADL's:  Intact  Cognition: WNL  Sleep:  Number of Hours: 2.75   Treatment Plan Summary:Brekyn Jerilynn Mages Gootee is a 35 y.o. AA female , who is married , employed , lives with her husband in [place] , denies past hx of mental illness, who presented voluntarily to [facility] as a walk in, accompanied by her parents, Ian Bushman & Neale Burly for aggressive, bizarre behavior at home. Pt continues to be hyper relegious, labile  and disorganized. Pt will continue to need treatment.  Treatment plan/summar: Daily contact with patient to assess and evaluate symptoms and progress in treatment and Medication management  Will continue Seroquel 200 mg po qhs for psychosis/mood sx, Serqouel can be titrated higher the next few days to augment the effect of Lithium. Will continue Lithium 300 mg po bid for mood lability.  Trazodone to 100 mg po Q hs routinely for sleep. Will make available PRN medications as per agitation protocol. Will continue to monitor vitals ,medication compliance and treatment side effects while patient is here.  Will monitor for medical issues as well as call consult as needed.   CSW will continue working on disposition.  Patient to participate in therapeutic milieu.  Continue current plan of  care.  Merian Capron, MD 03/14/2016, 12:12 PM

## 2016-03-14 NOTE — Progress Notes (Signed)
D: Patient withdrawn and isolative to her room. Pt hyper religious and constantly quoting scriptures and is paranoid of medications and staff. Pt appears to be responding to internal stimuli on assessment.  A: Q15 minute safety checks, encourage staff/peer interaction, administer medications as ordered. R: Pt compliant with HS medications but did not attend evening group session. No s/s of distress noted on assessment.

## 2016-03-14 NOTE — [facility] Group Notes (Signed)
[facility] Group Notes:  (Clinical Social Work)  03/14/2016  11:15-12:00PM  Summary of Progress/Problems:   Today's process group involved patients discussing their feelings related to being hospitalized, as well as how they can use their present feelings to create a plan for how to avoid future hospitalizations. The patient expressed her primary feeling about being hospitalized is "I'm enjoying it but would like to be at home better."  She spoke frequently throughout group and was religiously preoccupied.  Type of Therapy:  Group Therapy - Process  Participation Level:  Active  Participation Quality:  Attentive, Monopolizing and Redirectable  Affect:  Blunted  Cognitive:  Delusional  Insight:  Improving  Engagement in Therapy:  Improving  Modes of Intervention:  Exploration, Discussion  Ambrose MantleMareida Grossman-Orr, LCSW 03/14/2016, 1:27 PM

## 2016-03-15 NOTE — Progress Notes (Signed)
[name] appeared upset and agitated by what she had witnessed regarding another patient's anxiety and agitation. "I need something for anxiety. I'm very upset. I need Jesus," she told this Clinical research associatewriter at the med window. Zyprexa PRN given; results pending.

## 2016-03-15 NOTE — [facility] Group Notes (Signed)
[facility] Group Notes:  (Clinical Social Work)  03/15/2016  11:00AM-12:00PM  Summary of Progress/Problems:  The main focus of today's process group was to listen to a variety of genres of music and to identify that different types of music provoke different responses.  The patient then was able to identify personally what was soothing for them, as well as energizing, as well as how patient can personally use this knowledge in sleep habits, with depression, and with other symptoms.  The patient expressed at the beginning of group the overall feeling of wellbeing:  "pretty good."  She left the room at one point, and when she returned almost immediately had a religious outburst with her hands lifted and speaking unintelligible words.  She was in and out for most of group.  Type of Therapy:  Music Therapy   Participation Level:  Active  Participation Quality:  Inattentive  Affect:  Blunted  Cognitive:  Hallucinating  Insight:  Limited  Engagement in Therapy:  Limited  Modes of Intervention:   Activity, Exploration  Ambrose MantleMareida Grossman-Orr, LCSW 03/15/2016

## 2016-03-15 NOTE — Progress Notes (Signed)
Adult Psychoeducational Group Note  Date:  03/15/2016 Time:  8:52 PM  Group Topic/Focus:  Wrap-Up Group:   The focus of this group is to help patients review their daily goal of treatment and discuss progress on daily workbooks.  Participation Level:  Active  Participation Quality:  Attentive  Affect:  Appropriate  Cognitive:  Appropriate  Insight: Appropriate  Engagement in Group:  Engaged  Modes of Intervention:  Discussion  Additional Comments:  Pt mentioned she has been dizzy all day. Pt goal for tomorrow is to have hope that she will get better.   Merlinda FrederickKeshia S Cebastian Neis 03/15/2016, 8:52 PM

## 2016-03-15 NOTE — Progress Notes (Addendum)
D: Lawson FiscalLori was guarded upon approach in her room this a.m. Offered the choice of coming to the med room or having this writer bring her her a.m med, she assured this Clinical research associatewriter she was perfectly capable of coming to the window. She then decided this writer could bring her her med. She asked what the lithium was for and, when told, said "My mood is fine." But she did take medication. She denied SI/HI/AVH but then began speaking about Jesus. Some paranoia exhibited. Patient remained polite. She would only fill out half of her self inventory: good sleep, fair appetite, normal energy level, good concentration. She rated her depression level 7 and hopelessness 1.  A: Meds given as ordered. Q15 safety checks maintained. Support/encouragement offered. R: Pt remains free from harm and continues with treatment. Will continue to monitor for needs/safety.

## 2016-03-15 NOTE — Progress Notes (Signed)
Patient ID: Earnestine MealingLori M Bathgate, female   DOB: Apr 26, 1981, 35 y.o.   MRN: [id number] D: Patient standing at door waiting for her husband (Donnel) to pick her up. Pt believes discharging tonight. Pt is an elopement risk and had to be redirected multiple time to step away from the door. Denies  SI/HI and pain.Pt appeared to be responding to internal stimuli. Pt made statement about a train passing through her room and how Jesus is the only one allowed in her room. A: Support and encouragement offered as needed. Medications administered as prescribed.  R: Patient cooperative on unit. Will continue to monitor patient for safety and stability.

## 2016-03-15 NOTE — Progress Notes (Signed)
Patient ID: [name], female   DOB: January 19, 1981, 35 y.o.   MRN: [id number] [facility] MD Progress Note  03/15/2016 11:58 AM GWYNN Becker  MRN:  [id number]  Subjective:  [name] not isolative and was spending time in group room. Slept fair. Remains quite but not agitated.  Objective;[name] is a 35 y.o. AA female, who is married, employed, lives with her husband in [place], denies past hx of mental illness, who presented voluntarily to [facility] as a walk in, accompanied by her parents, [name] & Neale Burly for aggressive, bizarre behavior at home.  Patient seen and chart reviewed.Discussed patient with treatment team.  Patient today remains somewhat guarded but less isolative.  . Energy ; adequate Does not endorse hallucinations. Mood /affect :  guarded  Principal Problem: Bipolar disorder, curr episode mixed, severe, with psychotic features ([place]) Diagnosis:   Patient Active Problem List   Diagnosis Date Noted  . Bipolar disorder, curr episode mixed, severe, with psychotic features ([place]) [F31.64] 03/11/2016  . Cannabis use disorder, severe, dependence ([place]) [F12.20] 03/11/2016   Total Time spent with patient: 25 minutes   Past Medical History: denies hx of htn,dm,thyroid do. Past Surgical History  Procedure Laterality Date  . Cesarean section     Family History:  Family History  Problem Relation Age of Onset  . Mental illness Neg Hx    Family Psychiatric  History: Pt denies hx of mental illness in family. 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 Yes  . Special: Marijuana    Social History   Social History  . Marital Status: Single    Spouse Name: N/A  . Number of Children: N/A  . Years of Education: N/A   Social History Main Topics  . Smoking status: Never Smoker   . Smokeless tobacco: None  . Alcohol Use: None  . Drug Use: Yes     Special: Marijuana  . Sexual Activity: Not Asked   Other Topics Concern  . None   Social History Narrative   Additional Social History:    Pain Medications: none reported Prescriptions: none reported Over the Counter: none reported History of alcohol / drug use?:  (parents report hx of use, but no abuse; not sure if currently using)  Sleep: Poor  Appetite:  Fair  Current Medications: Current Facility-Administered Medications  Medication Dose Route Frequency Provider Last Rate Last Dose  . acetaminophen (TYLENOL) tablet 650 mg  650 mg Oral Q6H PRN Derrill Center, NP      . alum & mag hydroxide-simeth (MAALOX/MYLANTA) 200-200-20 MG/5ML suspension 30 mL  30 mL Oral Q4H PRN Derrill Center, NP      . lithium carbonate capsule 300 mg  300 mg Oral BID WC Ursula Alert, MD   300 mg at 03/15/16 0848  . LORazepam (ATIVAN) tablet 1 mg  1 mg Oral Q6H PRN Ursula Alert, MD   1 mg at 03/14/16 2119   Or  . LORazepam (ATIVAN) injection 1 mg  1 mg Intramuscular Q6H PRN Saramma Eappen, MD      . magnesium hydroxide (MILK OF MAGNESIA) suspension 30 mL  30 mL Oral Daily PRN Derrill Center, NP      . OLANZapine zydis (ZYPREXA) disintegrating tablet 10 mg  10 mg Oral Q8H PRN Saramma Eappen,  MD   10 mg at 03/15/16 1121   Or  . OLANZapine (ZYPREXA) injection 10 mg  10 mg Intramuscular Q8H PRN Saramma Eappen, MD      . QUEtiapine (SEROQUEL) tablet 200 mg  200 mg Oral QHS Ursula Alert, MD   200 mg at 03/14/16 2119  . traZODone (DESYREL) tablet 100 mg  100 mg Oral QHS Encarnacion Slates, NP   100 mg at 03/14/16 2119   Lab Results:  Results for orders placed or performed during the hospital encounter of 03/10/16 (from the past 48 hour(s))  Basic metabolic panel     Status: Abnormal   Collection Time: 03/14/16  6:29 AM  Result Value Ref Range   Sodium 141 135 - 145 mmol/L   Potassium 3.7 3.5 - 5.1 mmol/L   Chloride 105 101 - 111 mmol/L   CO2 26 22 - 32 mmol/L   Glucose, Bld 104 (H) 65 - 99 mg/dL   BUN  7 6 - 20 mg/dL   Creatinine, Ser 0.73 0.44 - 1.00 mg/dL   Calcium 9.3 8.9 - 10.3 mg/dL   GFR calc non Af Amer >60 >60 mL/min   GFR calc Af Amer >60 >60 mL/min    Comment: (NOTE) The eGFR has been calculated using the CKD EPI equation. This calculation has not been validated in all clinical situations. eGFR's persistently <60 mL/min signify possible Chronic Kidney Disease.    Anion gap 10 5 - 15    Comment: Performed at [facility]    Blood Alcohol level:  Lab Results  Component Value Date   [facility] <5 03/10/2016   ETH <5 02/17/2016   Physical Findings: AIMS: Facial and Oral Movements Muscles of Facial Expression: None, normal Lips and Perioral Area: None, normal Jaw: None, normal Tongue: None, normal,Extremity Movements Upper (arms, wrists, hands, fingers): None, normal Lower (legs, knees, ankles, toes): None, normal, Trunk Movements Neck, shoulders, hips: None, normal, Overall Severity Severity of abnormal movements (highest score from questions above): None, normal Incapacitation due to abnormal movements: None, normal Patient's awareness of abnormal movements (rate only patient's report): No Awareness, Dental Status Current problems with teeth and/or dentures?: No Does patient usually wear dentures?: No  CIWA:    COWS:     Musculoskeletal: Strength & Muscle Tone: within normal limits Gait & Station: normal Patient leans: N/A  Psychiatric Specialty Exam: Review of Systems  Constitutional: Negative for fever.  Skin: Negative for rash.  Psychiatric/Behavioral: Positive for substance abuse. The patient is nervous/anxious and has insomnia.   All other systems reviewed and are negative.   Blood pressure 116/82, pulse 123, temperature 98.4 F (36.9 C), temperature source Oral, resp. rate 20, SpO2 100 %.There is no height or weight on file to calculate BMI.  General Appearance: Disheveled  Eye Contact::  Minimal  Speech:  Pressured  Volume:  Increased   Mood:  Less labile  Affect: guarded  Thought Process:  irrevalent  Orientation:  Full (Time, Place, and Person)  Thought Content:  Paranoid delusional   Suicidal Thoughts:  No  Homicidal Thoughts:  No  Memory:  Immediate;   Fair Recent;   Fair Remote;   Fair  Judgement:  Impaired  Insight:  Lacking  Psychomotor Activity:  Increased  Concentration:  Poor  Recall:  [place]: Fair  Akathisia:  No  Handed:  Right  AIMS (if indicated):     Assets:  Desire for Improvement  ADL's:  Intact  Cognition:  WNL  Sleep:  Number of Hours: 6.75    Treatment plan/summar: Daily contact with patient to assess and evaluate symptoms and progress in treatment and Medication management  Will continue Seroquel 200 mg po qhs for psychosis/mood sx, Serqouel can be titrated higher the next few days to augment the effect of Lithium. Will continue Lithium 300 mg po bid for mood lability.  Trazodone to 100 mg po Q hs routinely for sleep. Will make available PRN medications as per agitation protocol. Will continue to monitor vitals ,medication compliance and treatment side effects while patient is here.  Will monitor for medical issues as well as call consult as needed.   CSW will continue working on disposition.  Patient to participate in therapeutic milieu.  Continue current plan of care.  Merian Capron, MD 03/15/2016, 11:58 AM

## 2016-03-16 MED ORDER — GUAIFENESIN 100 MG/5ML PO SOLN
5.0000 mL | Freq: Three times a day (TID) | ORAL | Status: DC
  Administered 2016-03-18: 100 mg via ORAL

## 2016-03-16 MED ORDER — QUETIAPINE FUMARATE 400 MG PO TABS
400.0000 mg | ORAL_TABLET | Freq: Every day | ORAL | Status: DC
  Administered 2016-03-16 – 2016-03-17 (×2): 400 mg via ORAL
  Filled 2016-03-16: qty 1
  Filled 2016-03-16: qty 7
  Filled 2016-03-16 (×2): qty 1

## 2016-03-16 MED ORDER — QUETIAPINE FUMARATE 400 MG PO TABS: 400.0000 mg | ORAL_TABLET | Freq: Every day | ORAL | Status: DC

## 2016-03-16 MED ORDER — TRAZODONE HCL 100 MG PO TABS
100.0000 mg | ORAL_TABLET | Freq: Every evening | ORAL | Status: DC | PRN
  Filled 2016-03-16: qty 7

## 2016-03-16 MED ORDER — QUETIAPINE FUMARATE 300 MG PO TABS: 300.0000 mg | ORAL_TABLET | Freq: Every day | ORAL | Status: DC

## 2016-03-16 NOTE — Progress Notes (Signed)
Adult Psychoeducational Group Note  Date:  03/16/2016 Time:  9:29 PM  Group Topic/Focus:  Wrap-Up Group:   The focus of this group is to help patients review their daily goal of treatment and discuss progress on daily workbooks.  Participation Level:  Minimal  Participation Quality:  Appropriate  Affect:  Flat  Cognitive:  Oriented  Insight: Limited  Engagement in Group:  Engaged  Modes of Intervention:  Socialization and Support  Additional Comments:  Patient attended and participated in group tonight. She patient was very subdue as she spoke. She reports having a restful day. She stated that [facility]. To day her Mom, Dad, Son and husband visited with her.  [name], [name] [facility] 03/16/2016, 9:29 PM

## 2016-03-16 NOTE — Progress Notes (Signed)
Patient ID: [name], female   DOB: [birth year], 35 y.o.   MRN: [id number] [facility] MD Progress Note  03/16/2016 2:57 PM [name]  MRN:  [id number]  Subjective: Pt states " I feel like I have a song in my heart .'   Objective;[name] is a 35 y.o. AA female, who is married, employed, lives with her husband in [place], denies past hx of mental illness, who presented voluntarily to [facility] as a walk in, accompanied by her parents, Charlott Rakes & Mercie Eon for aggressive, bizarre behavior at home. Patient seen and chart reviewed.Discussed patient with treatment team.  Patient today continues to be labile , appears to be delusional , mild euphoria periodically. Pt per staff continues to be intrusive , loud at times . Has been tolerating her medications well, sleep however is documented as poor.  Will continue to need treatment.   .Principal Problem: Bipolar disorder, curr episode mixed, severe, with psychotic features ([facility]) Diagnosis:   Patient Active Problem List   Diagnosis Date Noted  . Bipolar disorder, curr episode mixed, severe, with psychotic features ([facility]) [F31.64] 03/11/2016  . Cannabis use disorder, severe, dependence ([facility]) [F12.20] 03/11/2016   Total Time spent with patient: 25 minutes   Past Medical History: denies hx of htn,dm,thyroid do. Past Surgical History  Procedure Laterality Date  . Cesarean section     Family History: denies hx of HTN, DM, thyroid disease. Family History  Problem Relation Age of Onset  . Mental illness Neg Hx    Family Psychiatric  History: Pt denies hx of mental illness in family. 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 Yes  . Special: Marijuana    Social History   Social History  . Marital Status: Single    Spouse Name: N/A  . Number of Children: N/A  . Years of Education: N/A   Social History  Main Topics  . Smoking status: Never Smoker   . Smokeless tobacco: None  . Alcohol Use: None  . Drug Use: Yes    Special: Marijuana  . Sexual Activity: Not Asked   Other Topics Concern  . None   Social History Narrative   Additional Social History:    Pain Medications: none reported Prescriptions: none reported Over the Counter: none reported History of alcohol / drug use?:  (parents report hx of use, but no abuse; not sure if currently using)  Sleep: Poor  Appetite:  Fair  Current Medications: Current Facility-Administered Medications  Medication Dose Route Frequency Provider Last Rate Last Dose  . acetaminophen (TYLENOL) tablet 650 mg  650 mg Oral Q6H PRN Oneta Rack, NP      . alum & mag hydroxide-simeth (MAALOX/MYLANTA) 200-200-20 MG/5ML suspension 30 mL  30 mL Oral Q4H PRN Oneta Rack, NP      . guaiFENesin (ROBITUSSIN) 100 MG/5ML solution 100 mg  5 mL Oral TID [name], MD      . lithium carbonate capsule 300 mg  300 mg Oral BID WC [name], MD   300 mg at 03/16/16 0748  . LORazepam (ATIVAN) tablet 1 mg  1 mg Oral Q6H PRN [name], MD   1 mg at 03/16/16 0055   Or  . LORazepam (ATIVAN) injection 1 mg  1 mg Intramuscular Q6H PRN Laronica Bhagat  Tonya Wantz, MD      . magnesium hydroxide (MILK OF MAGNESIA) suspension 30 mL  30 mL Oral Daily PRN Oneta Rack, NP      . OLANZapine zydis (ZYPREXA) disintegrating tablet 10 mg  10 mg Oral Q8H PRN [name], MD   10 mg at 03/15/16 1121   Or  . OLANZapine (ZYPREXA) injection 10 mg  10 mg Intramuscular Q8H PRN Alto Gandolfo, MD      . QUEtiapine (SEROQUEL) tablet 400 mg  400 mg Oral QHS Favor Hackler, MD      . traZODone (DESYREL) tablet 100 mg  100 mg Oral QHS PRN [name], MD       Lab Results:  No results found for this or any previous visit (from the past 48 hour(s)).  Blood Alcohol level:  Lab Results  Component Value Date   ETH <5 03/10/2016   ETH <5 02/17/2016   Physical Findings: AIMS:  Facial and Oral Movements Muscles of Facial Expression: None, normal Lips and Perioral Area: None, normal Jaw: None, normal Tongue: None, normal,Extremity Movements Upper (arms, wrists, hands, fingers): None, normal Lower (legs, knees, ankles, toes): None, normal, Trunk Movements Neck, shoulders, hips: None, normal, Overall Severity Severity of abnormal movements (highest score from questions above): None, normal Incapacitation due to abnormal movements: None, normal Patient's awareness of abnormal movements (rate only patient's report): No Awareness, Dental Status Current problems with teeth and/or dentures?: No Does patient usually wear dentures?: No  CIWA:    COWS:     Musculoskeletal: Strength & Muscle Tone: within normal limits Gait & Station: normal Patient leans: N/A  Psychiatric Specialty Exam: Review of Systems  Constitutional: Negative for fever.  Skin: Negative for rash.  Psychiatric/Behavioral: Positive for substance abuse. The patient is nervous/anxious and has insomnia.   All other systems reviewed and are negative.   Blood pressure 127/83, pulse 150, temperature 98.4 F (36.9 C), temperature source Oral, resp. rate 17, SpO2 100 %.There is no height or weight on file to calculate BMI.  General Appearance: Disheveled  Eye Contact::  Minimal  Speech:  Pressured  Volume:  Increased  Mood:  Less labile  Affect: guarded  Thought Process:  irrevalent  Orientation:  Full (Time, Place, and Person)  Thought Content:  Paranoid , delusional , grandiose, AH   Suicidal Thoughts:  No  Homicidal Thoughts:  No  Memory:  Immediate;   Fair Recent;   Fair Remote;   Fair  Judgement:  Impaired  Insight:  Lacking  Psychomotor Activity:  Increased  Concentration:  Poor  Recall:  [employer] of Knowledge:Fair  Language: Fair  Akathisia:  No  Handed:  Right  AIMS (if indicated):     Assets:  Desire for Improvement  ADL's:  Intact  Cognition: WNL  Sleep:  Number of Hours:  3.75    Treatment plan/summar:[name] is a 35 y.o. AA female, who is married, employed, lives with her husband in [place], denies past hx of mental illness, who presented voluntarily to [facility] as a walk in, accompanied by her parents for aggressive, bizarre behavior at home. Daily contact with patient to assess and evaluate symptoms and progress in treatment and Medication management  Will increase Seroquel to 400 mg po qhs for psychosis/mood sx. Will continue Lithium 300 mg po bid for mood lability.Li level tomorrow ( 03/17/16)  Will change Trazodone to 100 mg po qhs prn  for sleep since she is also on Seroquel which has been titrated up.  Will make available PRN medications as per agitation protocol. Will continue to monitor vitals ,medication compliance and treatment side effects while patient is here.  Will monitor for medical issues as well as call consult as needed.  Recreational therapy consult. CSW will continue working on disposition.  Patient to participate in therapeutic milieu.  Rannie Craney, MD 03/16/2016, 2:57 PM

## 2016-03-16 NOTE — Progress Notes (Signed)
DAR NOTE: Patient affect is flat with depressed mood.  Denies pain, auditory and visual hallucinations.  Maintained on routine safety checks.  Medications given as prescribed.  Support and encouragement offered as needed.  Attended group and participated.  States goal for today is "discharge."  Patient observed socializing with peers in the dayroom.  Patient preoccupied with discharge.  Patient was tearful and angry in the Cafeteria during lunch and was sent back to the unit.

## 2016-03-16 NOTE — Tx Team (Signed)
Interdisciplinary Treatment Plan Update (Adult)  Date:  03/16/2016 Time Reviewed:  8:22 AM  Progress in Treatment: Attending groups: No, pt new to unit. Participating in groups: No, CSW still assessing. Taking medication as prescribed:  Yes. Tolerating medication:  Yes. Family/Significant othe contact made:  Yes, individual(s) contacted:  Latanya Maudlin (husband) [phone]  Patient understands diagnosis: No, limited insight Discussing patient identified problems/goals with staff:  Yes, see initial care plan. Medical problems stabilized or resolved:  Yes Denies suicidal/homicidal ideation: Yes. Issues/concerns per patient self-inventory: No. Other:  New problem(s) identified:  Latanya Maudlin was contacted and informed CSW that he is not pt's husband. He is an ex-coworker that knows pt minimally. [name] explained that pt has a delusion that he is her husband. [name] would like to be removed from any further contact.  Discharge Plan or Barriers: See below  Reason for Continuation of Hospitalization: Anxiety Hallucinations Medication stabilization Mood instability  Comments: [name] is a 35 y.o. AA female , who is married , employed , lives with her husband in [place] , denies past hx of mental illness, who presented voluntarily to [facility] as a walk in, accompanied by her parents, Ian Bushman & Neale Burly for aggressive, bizarre behavior at home. Lithium, Seroquel trial 03/16/16: Patient today continues  to be delusional , mild euphoria periodically. Pt per staff continues to be intrusive, loud at times    Will increase Seroquel to 400 mg po qhs for psychosis/mood sx. Will continue Lithium 300 mg po bid for mood lability.Li level tomorrow ( 03/17/16) Will change Trazodone to 100 mg po qhs prn for sleep since she is also on Seroquel which has been titrated up.   Estimated length of stay: 1-3 days  New goal(s):  Review of initial/current patient goals per problem list:   1. Goal(s): Patient will participate in aftercare plan  Met:Yes  Target date: at discharge  As evidenced by: Patient will participate within aftercare plan AEB aftercare provider and housing plan at discharge being identified.  03/11/16: Pt will return home and follow-up oupt     6. Goal (s): Patient will demonstrate decreased signs of mania  * Met: Progressing  * Target date: 3-5 days post admission date  * As evidenced by: Patient demonstrate decreased signs of mania AEB decreased mood instability and demonstration of stable mood   03/11/16: Pt presents with pressured speech, flight of ideas, labile mood. Pt also endorses AVH and is experiencing delusions. 03/16/16:  Mood is stable.  Thinking is clearer, though she still hedging on talking about her husband    Attendees: Patient:  03/16/2016 8:22 AM  Family:   03/16/2016 8:22 AM  Physician:  Dr. Ursula Alert, MD 03/16/2016 8:22 AM  Nursing: Larrie Kass., RN 03/16/2016 8:22 AM  Case Manager:  Roque Lias, LCSW 03/16/2016 8:22 AM  Counselor:  Matthew Saras, MSW Intern 03/16/2016 8:22 AM  Other:   03/16/2016 8:22 AM  Other:   03/16/2016 8:22 AM  Other:   03/16/2016 8:22 AM  Other:  03/16/2016 8:22 AM  Other:    Other:    Other:    Other:    Other:    Other:      Scribe for Treatment Team:   Georga Kaufmann, MSW Intern 03/16/2016 8:22 AM

## 2016-03-16 NOTE — [facility] Group Notes (Signed)
[facility] LCSW Group Therapy  03/16/2016 1:15 pm  Type of Therapy: Process Group Therapy  Participation Level:  Active  Participation Quality:  Appropriate  Affect:  Flat  Cognitive:  Oriented  Insight:  Improving  Engagement in Group:  Limited  Engagement in Therapy:  Limited  Modes of Intervention:  Activity, Clarification, Education, Problem-solving and Support  Summary of Progress/Problems: Today's group addressed the issue of overcoming obstacles.  Patients were asked to identify their biggest obstacle post d/c that stands in the way of their on-going success, and then problem solve as to how to manage this. Stayed for most of the group, engaged throughout.  Another patient told her "I can understand everything you are saying today."  Talked a lot about her faith in God and how she is learning to just "let go and let God."  Also talked about the importance of "knowing who I am."  No overtly psychotic statements, but did talk vaguely about her husband, and how God is "leading me to him."  Could not tell if she was talking about current husband, or someone else.  Ida Rogueorth, Morenike Cuff B 03/16/2016   3:45 PM

## 2016-03-16 NOTE — Progress Notes (Signed)
Patient ID: [name], female   DOB: [birth year], 35 y.o.   MRN: [id number] D: Patient appeared depressed and worried about everyone stating "I don't want anything bad to happen to anyone". Pt religious preoccupied praying over the bed in her room. Pt reports hearing voices of God telling her "it will be okay and not worry". Pt has been calm compared to previous days. suspicious of medication. Denies SI/HI and pain.  A: Support and encouragement offered as needed. Medications administered as prescribed.  R: Patient cooperative on unit. Will continue to monitor patient for safety and stability.

## 2016-03-17 LAB — LITHIUM LEVEL: Lithium Lvl: 0.33 mmol/L — ABNORMAL LOW (ref 0.60–1.20)

## 2016-03-17 MED ORDER — LITHIUM CARBONATE 300 MG PO CAPS
600.0000 mg | ORAL_CAPSULE | Freq: Every day | ORAL | Status: DC
  Administered 2016-03-17: 600 mg via ORAL
  Filled 2016-03-17 (×3): qty 2

## 2016-03-17 MED ORDER — LITHIUM CARBONATE 300 MG PO CAPS
300.0000 mg | ORAL_CAPSULE | Freq: Every day | ORAL | Status: DC
  Administered 2016-03-18: 300 mg via ORAL
  Filled 2016-03-17: qty 21
  Filled 2016-03-17 (×2): qty 1

## 2016-03-17 NOTE — Progress Notes (Signed)
DAR NOTE: Patient presents with flat affect and depressed mood.  Patient remained isolative to her room.  Reports symptoms of dizziness.  Patient reminded to get out of bed and chair slowly.  Patient refused morning medication stating "medication is too much for me and is making me drowsy."  MD made aware of complaint.  Routine safety checks maintained.  Patient preoccupied with discharge.  Patient did not attend group.  Minimal interaction with staff and peers.

## 2016-03-17 NOTE — Plan of Care (Signed)
Problem: Alteration in thought process Goal: STG-Patient does not respond to command hallucinations Outcome: Not Progressing Pt hyper religious reports "GOD is talking to her". Appears to be reponding to internal stimuli

## 2016-03-17 NOTE — Progress Notes (Signed)
D:Patient in the dayroom on approach.  Patient appears hyper but conversation appropriate.  Patient states she had a good day.  Patient is preoccupied with being discharged.  Patient states her parents visited today and states they are supportive.  Patient states she feels her medications are working.  Patient appeared calm until is was time to go to bed.  Patient became loud and stated there was a party going on here.  Patient states she does not need Seroquel tonight.  Writer convinced her to take it but she bit it in half.  Patient denies SI/HI and denies AVH. A: Staff to monitor Q 15 mins for safety.  Encouragement and support offered.  Scheduled medications administered per orders.   R: Patient remains safe on the unit.  Patient attended group tonight.  Patient visible on the unit and interacting with peers.  Patient taking administered medications.

## 2016-03-17 NOTE — [facility] Group Notes (Signed)
[facility] LCSW Group Therapy  03/17/2016 , 2:11 PM   Type of Therapy:  Group Therapy  Participation Level:  Active  Participation Quality:  Attentive  Affect:  Appropriate  Cognitive:  Alert  Insight:  Improving  Engagement in Therapy:  Engaged  Modes of Intervention:  Discussion, Exploration and Socialization  Summary of Progress/Problems: Today's group focused on the term Diagnosis.  Participants were asked to define the term, and then pronounce whether it is a negative, positive or neutral term.  Stayed the entire time, engaged throughout.  Was moved to tears when talking about the challenges of dealing with her emotions of anxiety and fear.  Identified herself as a prayerful, hopeful person who has "a song in my heart."  Vague.  [name], [name] 03/17/2016 , 2:11 PM

## 2016-03-17 NOTE — Progress Notes (Signed)
Patient ID: [name], female   DOB: 08-08-1981, 35 y.o.   MRN: [id number] Pt up most of the evening pacing in room. Pt is paranoid and restless. Pt refused trazodone earlier. Pt agreed to take vistaril to help calm down. 50 mg administered. Pt is currently resting in bed. Will continue to monitor.

## 2016-03-17 NOTE — [facility] Group Notes (Signed)
[facility] Group Notes:  (Nursing/MHT/Case Management/Adjunct)  Date:  03/17/2016  Time:  10:57 AM  Type of Therapy:  Nurse Education  Participation Level:  Did Not Attend     [name] 03/17/2016, 10:57 AM

## 2016-03-17 NOTE — Progress Notes (Signed)
Patient ID: [name], female   DOB: May 09, 1981, 35 y.o.   MRN: [id number] [facility] MD Progress Note  03/17/2016 12:45 PM [name]  MRN:  [id number]  Subjective: Pt states " I feel fine , I know I am married to my husband, but when I saw woody , I felt we were meant to be together . There was this thing feeling that we were married in spirit."    Objective;[name] is a 35 y.o. AA female, who is married, employed, lives with her husband in [place], denies past hx of mental illness, who presented voluntarily to [facility] as a walk in, accompanied by her parents, Charlott Rakes & Mercie Eon for aggressive, bizarre behavior at home. Patient seen and chart reviewed.Discussed patient with treatment team.  Patient today continues to be delusional ,however is less anxious , less labile than yesterday. Pt per staff has been more redirectable , tolerating her medications well, denies ADRs. Will continue to need treatment.   .Principal Problem: Bipolar disorder, curr episode mixed, severe, with psychotic features (HCC) Diagnosis:   Patient Active Problem List   Diagnosis Date Noted  . Bipolar disorder, curr episode mixed, severe, with psychotic features (HCC) [F31.64] 03/11/2016  . Cannabis use disorder, severe, dependence (HCC) [F12.20] 03/11/2016   Total Time spent with patient: 25 minutes   Past Medical History: denies hx of htn,dm,thyroid do. Past Surgical History  Procedure Laterality Date  . Cesarean section     Family History: denies hx of HTN, DM, thyroid disease. Family History  Problem Relation Age of Onset  . Mental illness Neg Hx    Family Psychiatric  History: Pt denies hx of mental illness in family. 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 Yes  . Special: Marijuana    Social History   Social History  . Marital Status: Single    Spouse  Name: N/A  . Number of Children: N/A  . Years of Education: N/A   Social History Main Topics  . Smoking status: Never Smoker   . Smokeless tobacco: None  . Alcohol Use: None  . Drug Use: Yes    Special: Marijuana  . Sexual Activity: Not Asked   Other Topics Concern  . None   Social History Narrative   Additional Social History:    Pain Medications: none reported Prescriptions: none reported Over the Counter: none reported History of alcohol / drug use?:  (parents report hx of use, but no abuse; not sure if currently using)  Sleep: Fair  Appetite:  Fair  Current Medications: Current Facility-Administered Medications  Medication Dose Route Frequency Provider Last Rate Last Dose  . acetaminophen (TYLENOL) tablet 650 mg  650 mg Oral Q6H PRN Oneta Rack, NP      . alum & mag hydroxide-simeth (MAALOX/MYLANTA) 200-200-20 MG/5ML suspension 30 mL  30 mL Oral Q4H PRN Oneta Rack, NP      . guaiFENesin (ROBITUSSIN) 100 MG/5ML solution 100 mg  5 mL Oral TID [name], MD   100 mg at 03/16/16 1602  . [START ON 03/18/2016] lithium carbonate capsule 300 mg  300 mg Oral Q breakfast Brylen Wagar, MD      . lithium carbonate capsule 600 mg  600 mg Oral Q supper [name], MD      .  LORazepam (ATIVAN) tablet 1 mg  1 mg Oral Q6H PRN [name], MD   1 mg at 03/17/16 0644   Or  . LORazepam (ATIVAN) injection 1 mg  1 mg Intramuscular Q6H PRN [name], MD      . magnesium hydroxide (MILK OF MAGNESIA) suspension 30 mL  30 mL Oral Daily PRN Oneta Rackanika N Lewis, NP      . OLANZapine zydis (ZYPREXA) disintegrating tablet 10 mg  10 mg Oral Q8H PRN Jomarie LongsSaramma Zamoria Boss, MD   10 mg at 03/15/16 1121   Or  . OLANZapine (ZYPREXA) injection 10 mg  10 mg Intramuscular Q8H PRN [name], MD      . QUEtiapine (SEROQUEL) tablet 400 mg  400 mg Oral QHS [name], MD   400 mg at 03/16/16 2121  . traZODone (DESYREL) tablet 100 mg  100 mg Oral QHS PRN Jomarie LongsSaramma [name], MD       Lab  Results:  Results for orders placed or performed during the hospital encounter of 03/10/16 (from the past 48 hour(s))  Lithium level     Status: Abnormal   Collection Time: 03/17/16  6:30 AM  Result Value Ref Range   Lithium Lvl 0.33 (L) 0.60 - 1.20 mmol/L    Comment: Performed at [facility]    Blood Alcohol level:  Lab Results  Component Value Date   [facility] 03/10/2016   ETH <5 02/17/2016   Physical Findings: AIMS: Facial and Oral Movements Muscles of Facial Expression: None, normal Lips and Perioral Area: None, normal Jaw: None, normal Tongue: None, normal,Extremity Movements Upper (arms, wrists, hands, fingers): None, normal Lower (legs, knees, ankles, toes): None, normal, Trunk Movements Neck, shoulders, hips: None, normal, Overall Severity Severity of abnormal movements (highest score from questions above): None, normal Incapacitation due to abnormal movements: None, normal Patient's awareness of abnormal movements (rate only patient's report): No Awareness, Dental Status Current problems with teeth and/or dentures?: No Does patient usually wear dentures?: No  CIWA:    COWS:     Musculoskeletal: Strength & Muscle Tone: within normal limits Gait & Station: normal Patient leans: N/A  Psychiatric Specialty Exam: Review of Systems  Constitutional: Negative for fever.  Skin: Negative for rash.  Psychiatric/Behavioral: Positive for substance abuse. The patient is nervous/anxious.   All other systems reviewed and are negative.   Blood pressure 118/93, pulse 132, temperature 98.4 F (36.9 C), temperature source Oral, resp. rate 20, SpO2 100 %.There is no height or weight on file to calculate BMI.  General Appearance: Casual  Eye Contact::  Minimal  Speech:  Normal Rate  Volume:  Normal  Mood:  Less labile  Affect: guarded  Thought Process:  irrevalent at times  Orientation:  Full (Time, Place, and Person)  Thought Content:  Paranoid , delusional ,  grandiose  Suicidal Thoughts:  No  Homicidal Thoughts:  No  Memory:  Immediate;   Fair Recent;   Fair Remote;   Fair  Judgement:  Impaired  Insight:  Lacking  Psychomotor Activity:  Restlessness  Concentration:  Poor  Recall:  FiservFair  Fund of Knowledge:Fair  Language: Fair  Akathisia:  No  Handed:  Right  AIMS (if indicated):     Assets:  Desire for Improvement  ADL's:  Intact  Cognition: WNL  Sleep:  Number of Hours: 5.5    Treatment plan/summar:Kyra Judie PetitM Randie HeinzCain is a 35 y.o. AA female, who is married, employed, lives [place], denies past hx of mental  illness, who presented voluntarily to [facility] as a walk in, accompanied by her parents for aggressive, bizarre behavior at home. Pt today seen as delusional , focussed on her spiritual marriage with her colleague. Will continue treatment.  Daily contact with patient to assess and evaluate symptoms and progress in treatment and Medication management  Will continue Seroquel  400 mg po qhs for psychosis/mood sx.Pt c/o dizziness - will observe , monitor VS. Will increase  Lithium to 300 mg po daily and 600 mg po qpm  for mood lability.Li level - 0.33 mmols/l ( 03/17/16)  Will continue Trazodone  100 mg po qhs prn  for sleep since she is also on Seroquel which has been titrated up. Will make available PRN medications as per agitation protocol. Will continue to monitor vitals ,medication compliance and treatment side effects while patient is here.  Will monitor for medical issues as well as call consult as needed.  Recreational therapy consult. CSW will continue working on disposition.  Patient to participate in therapeutic milieu.  Asmaa Tirpak, MD 03/17/2016, 12:45 PM

## 2016-03-18 MED ORDER — QUETIAPINE FUMARATE 400 MG PO TABS: 400.0000 mg | ORAL_TABLET | Freq: Every day | ORAL | Status: AC

## 2016-03-18 MED ORDER — TRAZODONE HCL 100 MG PO TABS: 100.0000 mg | ORAL_TABLET | Freq: Every evening | ORAL | Status: AC | PRN

## 2016-03-18 MED ORDER — LITHIUM CARBONATE 300 MG PO CAPS: ORAL_CAPSULE | ORAL | Status: AC

## 2016-03-18 NOTE — Progress Notes (Signed)
Patient verbalizes readiness for discharge. Follow up plan explained, Rx's given along with sample meds and all belongings returned. Patient verbalizes understanding. Denies SI/HI and assures this Clinical research associatewriter she will seek assistance should that change. Discharged ambulatory and in stable condition to mother.

## 2016-03-18 NOTE — Discharge Summary (Signed)
Physician Discharge Summary Note  Patient:  [name] is an 35 y.o., female MRN:  [id number] DOB:  [birth year] Patient phone:  [phone] (home)  Patient address:   [address] [place] [address],  Total Time spent with patient: 30 minutes  Date of Admission:  03/10/2016 Date of Discharge: 03/18/2016  Reason for Admission:  Aggressive and bizarre behavior  Principal Problem: Bipolar disorder, curr episode mixed, severe, with psychotic features [facility](HCC) Discharge Diagnoses: Patient Active Problem List   Diagnosis Date Noted  . Bipolar disorder, curr episode mixed, severe, with psychotic features (HCC) [F31.64] 03/11/2016  . Cannabis use disorder, severe, dependence (HCC) [F12.20] 03/11/2016    Past Psychiatric History:  See above noted                Past Medical History: History reviewed. No pertinent past medical history.  Past Surgical History  Procedure Laterality Date  . Cesarean section     Family History:  Family History  Problem Relation Age of Onset  . Mental illness Neg Hx    Family Psychiatric  History:  See above noted Social History:  History  Alcohol Use: Not on file     History  Drug Use  . Yes  . Special: Marijuana    Social History   Social History  . Marital Status: Single    Spouse Name: N/A  . Number of Children: N/A  . Years of Education: N/A   Social History Main Topics  . Smoking status: Never Smoker   . Smokeless tobacco: None  . Alcohol Use: None  . Drug Use: Yes    Special: Marijuana  . Sexual Activity: Not Asked   Other Topics Concern  . None   Social History Narrative    Hospital Course:  Natalie MealingLori M Becker is a 35 y.o. AA female denied any past hx of mental illness when [facility].  She was accompanied by her parents as patient reportedly exhibiting aggressive, bizarre behavior at home.    [name] was admitted for Bipolar disorder, curr episode mixed, severe, with psychotic  features (HCC) and crisis management.  She was treated with Seroquel 400 mg for psychosis/mood symptoms, Lithium 300 mg in morning and 600 mg evening time  for mood lability.  Her Li level - 0.33 mmols/l ( 03/17/16), patient was advised to have level drawn on April 1st Saturday.  She was prescribed Trazodone 100 mg as needed for sleep since she is also on Seroquel which has been titrated up and PRN medications as per agitation protocol.  Medical problems were identified and treated as needed.  Home medications were restarted as appropriate.  Improvement was monitored by observation and [name] daily report of symptom reduction.  Emotional and mental status was monitored by daily self inventory reports completed by [name] and clinical staff.  Patient reported continued improvement, denied any new concerns.  Patient had been compliant on medications and denied side effects.  Support and encouragement was provided.    Patient did well during inpatient stay.  At time of discharge, patient rated both depression and anxiety levels to be manageable and minimal.  Patient was able to identify the triggers of emotional crises and de-stabilizations.  Patient identified the positive things in life that would help in dealing with feelings of loss, depression and unhealthy or abusive tendencies.         [name] was evaluated by the  treatment team for stability and plans for continued recovery upon discharge.  She was offered further treatment options upon discharge including Residential, Intensive Outpatient and Outpatient treatment.  She will follow up with agencies listed below for medication management and counseling.  Encouraged patient to maintain satisfactory support network and home environment.  Advised to adhere to medication compliance and outpatient treatment follow up.      Natalie Mealing motivation was an integral factor for scheduling further treatment.  Employment, transportation, bed  availability, health status, family support, and any pending legal issues were also considered during her hospital stay.  Upon completion of this admission the patient was both mentally and medically stable for discharge denying suicidal/homicidal ideation, auditory/visual/tactile hallucinations, delusional thoughts and paranoia.      Physical Findings: AIMS: Facial and Oral Movements Muscles of Facial Expression: None, normal Lips and Perioral Area: None, normal Jaw: None, normal Tongue: None, normal,Extremity Movements Upper (arms, wrists, hands, fingers): None, normal Lower (legs, knees, ankles, toes): None, normal, Trunk Movements Neck, shoulders, hips: None, normal, Overall Severity Severity of abnormal movements (highest score from questions above): None, normal Incapacitation due to abnormal movements: None, normal Patient's awareness of abnormal movements (rate only patient's report): No Awareness, Dental Status Current problems with teeth and/or dentures?: No Does patient usually wear dentures?: No  CIWA:    COWS:     Musculoskeletal: Strength & Muscle Tone: within normal limits Gait & Station: normal Patient leans: N/A  Psychiatric Specialty Exam: Review of Systems  Psychiatric/Behavioral: Negative for depression, suicidal ideas and hallucinations.  All other systems reviewed and are negative.   Blood pressure 131/76, pulse 122, temperature 98.2 F (36.8 C), temperature source Oral, resp. rate 20, SpO2 100 %.There is no height or weight on file to calculate BMI.  Have you used any form of tobacco in the last 30 days? (Cigarettes, Smokeless Tobacco, Cigars, and/or Pipes): No  Has this patient used any form of tobacco in the last 30 days? (Cigarettes, Smokeless Tobacco, Cigars, and/or Pipes) NA  Blood Alcohol level:  Lab Results  Component Value Date   [facility] <5 03/10/2016   ETH <5 02/17/2016    Metabolic Disorder Labs:  Lab Results  Component Value Date   HGBA1C 5.4  03/10/2016   MPG 108 03/10/2016   Lab Results  Component Value Date   PROLACTIN 23.1 03/10/2016   Lab Results  Component Value Date   CHOL 137 03/11/2016   TRIG 146 03/11/2016   HDL 30* 03/11/2016   CHOLHDL 4.6 03/11/2016   VLDL 29 03/11/2016   LDLCALC 78 03/11/2016    See Psychiatric Specialty Exam and Suicide Risk Assessment completed by Attending Physician prior to discharge.  Discharge destination:  Home  Is patient on multiple antipsychotic therapies at discharge:  No   Has Patient had three or more failed trials of antipsychotic monotherapy by history:  No  Recommended Plan for Multiple Antipsychotic Therapies: NA     Medication List    STOP taking these medications        ibuprofen 200 MG tablet  Commonly known as:  ADVIL,MOTRIN     ondansetron 4 MG disintegrating tablet  Commonly known as:  ZOFRAN-ODT      TAKE these medications      Indication   lithium carbonate 300 MG capsule  Take 1 capsule (300 mg) in the am.  Then take 2 capsules (600 mg) in the pm.   Indication:  mood stabilization     QUEtiapine 400 MG  tablet  Commonly known as:  SEROQUEL  Take 1 tablet (400 mg total) by mouth at bedtime.   Indication:  mood stabilization     traZODone 100 MG tablet  Commonly known as:  DESYREL  Take 1 tablet (100 mg total) by mouth at bedtime as needed for sleep.   Indication:  Trouble Sleeping       Follow-up Information    [facility] up with [facility]   Specialty:  Behavioral Health   Why:  Go to the walk in clinic M-F between 8 and 11AM for your hospital follow up appointment.  You have already seen the therapist, but you still need to see the Dr.  After this, you will be given appointment times and dates.   Contact information:   [address] [place] [address] [phone]       Follow up with Outpatient provider.   Why:  Please obtain a Lithium level Saturday April 1st      Follow-up recommendations:  Activity:  as tol Diet:  as  tol  Comments:  1.  Take all your medications as prescribed.   2.  Report any adverse side effects to outpatient provider. 3.  Patient instructed to not use alcohol or illegal drugs while on prescription medicines. 4.  In the event of worsening symptoms, instructed patient to call 911, the crisis hotline or go to nearest emergency room for evaluation of symptoms.  Signed: Lindwood Qua, NP [facility] 03/18/2016, 11:46 AM

## 2016-03-18 NOTE — Tx Team (Signed)
Interdisciplinary Treatment Plan Update (Adult)  Date:  03/18/2016 Time Reviewed:  10:43 AM  Progress in Treatment: Attending groups: No, pt new to unit. Participating in groups: No, CSW still assessing. Taking medication as prescribed:  Yes. Tolerating medication:  Yes. Family/Significant othe contact made:  Yes, individual(s) contacted:  Latanya Maudlin (husband) [phone]  Patient understands diagnosis: No, limited insight Discussing patient identified problems/goals with staff:  Yes, see initial care plan. Medical problems stabilized or resolved:  Yes Denies suicidal/homicidal ideation: Yes. Issues/concerns per patient self-inventory: No. Other:  New problem(s) identified:    Discharge Plan or Barriers: See below  Reason for Continuation of Hospitalization:   Comments: [name] is a 35 y.o. AA female , who is married , employed , lives with her husband in [place] , denies past hx of mental illness, who presented voluntarily to [facility] as a walk in, accompanied by her parents, Ian Bushman & Neale Burly for aggressive, bizarre behavior at home. Lithium, Seroquel trial 03/16/16: Patient today continues  to be delusional , mild euphoria periodically. Pt per staff continues to be intrusive, loud at times    Will increase Seroquel to 400 mg po qhs for psychosis/mood sx. Will continue Lithium 300 mg po bid for mood lability.Li level tomorrow ( 03/17/16) Will change Trazodone to 100 mg po qhs prn for sleep since she is also on Seroquel which has been titrated up.   Estimated length of stay: D/C today  New goal(s):  Review of initial/current patient goals per problem list:  1. Goal(s): Patient will participate in aftercare plan  Met:Yes  Target date: at discharge  As evidenced by: Patient will participate within aftercare plan AEB aftercare provider and housing plan at discharge being identified.  03/11/16: Pt will return home and follow-up oupt     6. Goal  (s): Patient will demonstrate decreased signs of mania  * Met: Yes  * Target date: 3-5 days post admission date  * As evidenced by: Patient demonstrate decreased signs of mania AEB decreased mood instability and demonstration of stable mood   03/11/16: Pt presents with pressured speech, flight of ideas, labile mood. Pt also endorses AVH and is experiencing delusions. 03/16/16:  Mood is stable.  Thinking is clearer, though she still hedging on talking about her husband 03/18/16:  No signs nor symptoms of psychosis today    Attendees: Patient:  03/18/2016 10:43 AM  Family:   03/18/2016 10:43 AM  Physician:  Dr. Ursula Alert, MD 03/18/2016 10:43 AM  Nursing: Larrie Kass., RN 03/18/2016 10:43 AM  Case Manager:  Roque Lias, LCSW 03/18/2016 10:43 AM  Counselor:  Matthew Saras, MSW Intern 03/18/2016 10:43 AM  Other:   03/18/2016 10:43 AM  Other:   03/18/2016 10:43 AM  Other:   03/18/2016 10:43 AM  Other:  03/18/2016 10:43 AM  Other:    Other:    Other:    Other:    Other:    Other:      Scribe for Treatment Team:   Ripley Fraise  03/18/2016 10:43 AM

## 2016-03-18 NOTE — Progress Notes (Signed)
  [facility] Adult Case Management Discharge Plan :  Will you be returning to the same living situation after discharge:  Yes,  home At discharge, do you have transportation home?: Yes,  mother Do you have the ability to pay for your medications: Yes,  mental health  Release of information consent forms completed and in the chart;  Patient's signature needed at discharge.  Patient to Follow up at: Follow-up Information    [facility]   Specialty:  Behavioral Health   Why:  Go to the walk in clinic M-F between 8 and 11AM for your hospital follow up appointment.  You have already seen the therapist, but you still need to see the Dr.  After this, you will be given appointment times and dates.   Contact information:   [address] [place] [address] [phone]       Next level of care provider has access to [facility] Link:no  Safety Planning and Suicide Prevention discussed: Yes,  yes  Have you used any form of tobacco in the last 30 days? (Cigarettes, Smokeless Tobacco, Cigars, and/or Pipes): No  Has patient been referred to the Quitline?: Yes, faxed on 03/18/16  Patient has been referred for addiction treatment: Yes  Ida Rogueorth, Pau Banh B 03/18/2016, 10:44 AM

## 2016-03-18 NOTE — Progress Notes (Signed)
Patient up and visible in the milieu. Affect animated, patient smiling and mood pleasant. Patient slept 5.25 hours. Denies pain, physical complaints but does request prn medication for anxiety. Medicated per orders, ativan prn given. Emotional support provided. Will encourage patient to complete self inventory. On reassess, patient reports decreased anxiety. She does endorse AH - "I see faces" - however does not report distress with this. No VH, SI, HI and patient remains safe on level III obs. Per treatment team discussion, patient will discharge today.

## 2016-03-18 NOTE — [facility] Suicide Risk Assessment (Signed)
[facility]   Principal Problem: Bipolar disorder, curr episode mixed, severe, with psychotic features (HCC) acute phase resolved Discharge Diagnoses:  Patient Active Problem List   Diagnosis Date Noted  . Bipolar disorder, curr episode mixed, severe, with psychotic features (HCC) [F31.64] 03/11/2016  . Cannabis use disorder, severe, dependence (HCC) [F12.20] 03/11/2016    Total Time spent with patient: 30 minutes  Musculoskeletal: Strength & Muscle Tone: within normal limits Gait & Station: normal Patient leans: N/A  Psychiatric Specialty Exam: Review of Systems  Psychiatric/Behavioral: Positive for substance abuse. Negative for depression, suicidal ideas and hallucinations. The patient is not nervous/anxious.   All other systems reviewed and are negative.   Blood pressure 131/76, pulse 122, temperature 98.2 F (36.8 C), temperature source Oral, resp. rate 20, SpO2 100 %.There is no height or weight on file to calculate BMI.  General Appearance: Casual  Eye Contact::  Fair  Speech:  Clear and Coherent409  Volume:  Normal  Mood:  Euthymic  Affect:  Congruent  Thought Process:  Goal Directed  Orientation:  Full (Time, Place, and Person)  Thought Content:  WDL  Suicidal Thoughts:  No  Homicidal Thoughts:  No  Memory:  Immediate;   Fair Recent;   Fair Remote;   Fair  Judgement:  Fair  Insight:  Fair  Psychomotor Activity:  Normal  Concentration:  Fair  Recall:  [employer]Fair  Fund of Knowledge:Fair  Language: Fair  Akathisia:  No  Handed:  Right  AIMS (if indicated):   0  Assets:  Desire for Improvement  Sleep:  Number of Hours: 5.25  Cognition: WNL  ADL's:  Intact   Mental Status Per Nursing Assessment::   On Admission:     Demographic Factors:  NA  Loss Factors: Decrease in vocational status  Historical Factors: Impulsivity  Risk Reduction Factors:   Positive social support  Continued Clinical Symptoms:  Alcohol/Substance  Abuse/Dependencies  Cognitive Features That Contribute To Risk:  None    Suicide Risk:  Minimal: No identifiable suicidal ideation.  Patients presenting with no risk factors but with morbid ruminations; may be classified as minimal risk based on the severity of the depressive symptoms    Plan Of Care/Follow-up recommendations:  Activity:  no restrictions Diet:  regular Tests:  Li level on 03/21/16 Other:  none  Natalie Clarey, MD 03/18/2016, 9:18 AM

## 2023-03-09 ENCOUNTER — Other Ambulatory Visit: Payer: Self-pay | Admitting: Nurse Practitioner

## 2023-03-09 DIAGNOSIS — Z1151 Encounter for screening for human papillomavirus (HPV): Secondary | ICD-10-CM | POA: Diagnosis not present

## 2023-03-09 DIAGNOSIS — N898 Other specified noninflammatory disorders of vagina: Secondary | ICD-10-CM | POA: Diagnosis not present

## 2023-03-09 DIAGNOSIS — Z1231 Encounter for screening mammogram for malignant neoplasm of breast: Secondary | ICD-10-CM

## 2023-03-09 DIAGNOSIS — Z124 Encounter for screening for malignant neoplasm of cervix: Secondary | ICD-10-CM | POA: Diagnosis not present

## 2023-03-09 DIAGNOSIS — Z01419 Encounter for gynecological examination (general) (routine) without abnormal findings: Secondary | ICD-10-CM | POA: Diagnosis not present

## 2023-03-09 DIAGNOSIS — Z113 Encounter for screening for infections with a predominantly sexual mode of transmission: Secondary | ICD-10-CM | POA: Diagnosis not present

## 2023-05-29 DIAGNOSIS — H52209 Unspecified astigmatism, unspecified eye: Secondary | ICD-10-CM | POA: Diagnosis not present

## 2023-05-29 DIAGNOSIS — H524 Presbyopia: Secondary | ICD-10-CM | POA: Diagnosis not present

## 2023-05-29 DIAGNOSIS — H5203 Hypermetropia, bilateral: Secondary | ICD-10-CM | POA: Diagnosis not present

## 2023-05-29 DIAGNOSIS — H4710 Unspecified papilledema: Secondary | ICD-10-CM | POA: Diagnosis not present

## 2024-03-31 DIAGNOSIS — Z01419 Encounter for gynecological examination (general) (routine) without abnormal findings: Secondary | ICD-10-CM | POA: Diagnosis not present
